# Patient Record
Sex: Female | Born: 1980 | Race: Black or African American | Hispanic: No | Marital: Single | Smoking: Current every day smoker
Health system: Southern US, Community
[De-identification: ages and names within clinical notes are randomized; demographics above are authoritative.]

## PROBLEM LIST (undated history)

## (undated) DIAGNOSIS — I10 Essential (primary) hypertension: Secondary | ICD-10-CM

## (undated) HISTORY — PX: TONSILLECTOMY: SUR1361

## (undated) HISTORY — PX: OTHER SURGICAL HISTORY: SHX169

---

## 2001-09-07 ENCOUNTER — Inpatient Hospital Stay (HOSPITAL_COMMUNITY): Admission: AD | Admit: 2001-09-07 | Discharge: 2001-09-07 | Payer: Self-pay | Admitting: *Deleted

## 2001-10-02 ENCOUNTER — Emergency Department (HOSPITAL_COMMUNITY): Admission: EM | Admit: 2001-10-02 | Discharge: 2001-10-03 | Payer: Self-pay | Admitting: Emergency Medicine

## 2001-10-14 ENCOUNTER — Other Ambulatory Visit: Admission: RE | Admit: 2001-10-14 | Discharge: 2001-10-14 | Payer: Self-pay | Admitting: *Deleted

## 2002-05-31 ENCOUNTER — Emergency Department (HOSPITAL_COMMUNITY): Admission: EM | Admit: 2002-05-31 | Discharge: 2002-05-31 | Payer: Self-pay | Admitting: Emergency Medicine

## 2002-09-14 ENCOUNTER — Other Ambulatory Visit: Admission: RE | Admit: 2002-09-14 | Discharge: 2002-09-14 | Payer: Self-pay | Admitting: *Deleted

## 2003-03-24 ENCOUNTER — Inpatient Hospital Stay (HOSPITAL_COMMUNITY): Admission: AD | Admit: 2003-03-24 | Discharge: 2003-03-28 | Payer: Self-pay | Admitting: *Deleted

## 2003-05-04 ENCOUNTER — Other Ambulatory Visit: Admission: RE | Admit: 2003-05-04 | Discharge: 2003-05-04 | Payer: Self-pay | Admitting: *Deleted

## 2003-07-20 ENCOUNTER — Emergency Department (HOSPITAL_COMMUNITY): Admission: EM | Admit: 2003-07-20 | Discharge: 2003-07-20 | Payer: Self-pay | Admitting: Emergency Medicine

## 2003-08-03 ENCOUNTER — Inpatient Hospital Stay (HOSPITAL_COMMUNITY): Admission: AD | Admit: 2003-08-03 | Discharge: 2003-08-03 | Payer: Self-pay | Admitting: Obstetrics and Gynecology

## 2003-08-05 ENCOUNTER — Inpatient Hospital Stay (HOSPITAL_COMMUNITY): Admission: AD | Admit: 2003-08-05 | Discharge: 2003-08-05 | Payer: Self-pay | Admitting: *Deleted

## 2004-05-17 ENCOUNTER — Observation Stay (HOSPITAL_COMMUNITY): Admission: AD | Admit: 2004-05-17 | Discharge: 2004-05-17 | Payer: Self-pay | Admitting: Obstetrics and Gynecology

## 2004-05-22 ENCOUNTER — Other Ambulatory Visit: Admission: RE | Admit: 2004-05-22 | Discharge: 2004-05-22 | Payer: Self-pay | Admitting: Obstetrics and Gynecology

## 2004-10-17 ENCOUNTER — Inpatient Hospital Stay (HOSPITAL_COMMUNITY): Admission: AD | Admit: 2004-10-17 | Discharge: 2004-10-17 | Payer: Self-pay | Admitting: Obstetrics and Gynecology

## 2004-11-23 ENCOUNTER — Inpatient Hospital Stay (HOSPITAL_COMMUNITY): Admission: RE | Admit: 2004-11-23 | Discharge: 2004-11-26 | Payer: Self-pay | Admitting: Obstetrics and Gynecology

## 2005-01-03 ENCOUNTER — Other Ambulatory Visit: Admission: RE | Admit: 2005-01-03 | Discharge: 2005-01-03 | Payer: Self-pay | Admitting: Obstetrics and Gynecology

## 2005-07-10 ENCOUNTER — Inpatient Hospital Stay (HOSPITAL_COMMUNITY): Admission: AD | Admit: 2005-07-10 | Discharge: 2005-07-10 | Payer: Self-pay | Admitting: Obstetrics & Gynecology

## 2007-10-03 ENCOUNTER — Inpatient Hospital Stay (HOSPITAL_COMMUNITY): Admission: AD | Admit: 2007-10-03 | Discharge: 2007-10-03 | Payer: Self-pay | Admitting: Obstetrics and Gynecology

## 2007-11-09 ENCOUNTER — Inpatient Hospital Stay (HOSPITAL_COMMUNITY): Admission: AD | Admit: 2007-11-09 | Discharge: 2007-11-09 | Payer: Self-pay | Admitting: Obstetrics and Gynecology

## 2008-02-20 ENCOUNTER — Emergency Department (HOSPITAL_COMMUNITY): Admission: EM | Admit: 2008-02-20 | Discharge: 2008-02-20 | Payer: Self-pay | Admitting: Emergency Medicine

## 2008-05-17 ENCOUNTER — Inpatient Hospital Stay (HOSPITAL_COMMUNITY): Admission: RE | Admit: 2008-05-17 | Discharge: 2008-05-20 | Payer: Self-pay | Admitting: Obstetrics and Gynecology

## 2008-05-17 ENCOUNTER — Encounter (INDEPENDENT_AMBULATORY_CARE_PROVIDER_SITE_OTHER): Payer: Self-pay | Admitting: Obstetrics and Gynecology

## 2010-04-17 LAB — CBC
HCT: 31.8 % — ABNORMAL LOW (ref 36.0–46.0)
HCT: 36.4 % (ref 36.0–46.0)
Hemoglobin: 10.7 g/dL — ABNORMAL LOW (ref 12.0–15.0)
Hemoglobin: 12.3 g/dL (ref 12.0–15.0)
MCHC: 33.5 g/dL (ref 30.0–36.0)
MCV: 89.7 fL (ref 78.0–100.0)
RBC: 4.11 MIL/uL (ref 3.87–5.11)
RDW: 14.8 % (ref 11.5–15.5)
RDW: 14.8 % (ref 11.5–15.5)
WBC: 10.3 10*3/uL (ref 4.0–10.5)

## 2010-05-22 NOTE — Op Note (Signed)
NAME:  Dorothy Jimenez, Dorothy Jimenez               ACCOUNT NO.:  [id number]   MEDICAL RECORD NO.:  [id number]          PATIENT TYPE:  INP   LOCATION:  9143                          FACILITY:  [facility]   PHYSICIAN:  Michelle L. Grewal, M.D.DATE OF BIRTH:  May 23, 1980   DATE OF PROCEDURE:  05/17/2008  DATE OF DISCHARGE:                               OPERATIVE REPORT   PREOPERATIVE DIAGNOSES:  1. Intrauterine pregnancy at 39+ weeks.  2. Previous cesarean section x2.  3. Desires a tubal sterilization.   POSTOPERATIVE DIAGNOSES:  1. Intrauterine pregnancy at 39+ weeks.  2. Previous cesarean section x2.  3. Desires a tubal sterilization.   PROCEDURE:  Repeat low transverse cesarean section and bilateral tubal  ligation modified Pomeroy method.   SURGEON:  Michelle L. Vincente Poli, MD.   ANESTHESIA:  Spinal.   FINDINGS:  Female infant in cephalic presentation.  Apgars of 9 at 1  minute and 9 at 5 minutes.   SPECIMENS:  This fallopian tube segments sent to Pathology.   ESTIMATED BLOOD LOSS:  Less than 500.   COMPLICATIONS:  None.   DESCRIPTION OF PROCEDURE:  The patient is taken to the operating room.  Her spinal was placed and found to be adequate.  She was prepped and  draped in usual sterile fashion.  A Foley catheter was inserted and  draining clear urine.  A low transverse incision was made and carried  down to the fascia.  Fascia scored in the midline and extended  laterally.  The rectus muscles were separated in midline.  The  peritoneum was entered bluntly.  The peritoneal incision was then  stretched.  The bladder blade was inserted.  The lower uterine segment  was identified.  The bladder flap was created sharply and then  digitally.  A low transverse incision was made in the uterus.  Uterus  was entered using a hemostat.  Amniotic fluid was clear.  She had  abundant amniotic fluid.  The baby was in cephalic presentation, was  delivered easily with one gentle pull of the vacuum.  The baby was  a  female infant at Apgars 9 at 1 minute and 9 at 5 minutes and was handed  to the awaiting neonatal team and taken to the newborn nursery.  The  cord was noted to be normal intact with a three-vessel cord.  The  placenta was manually removed, noted be normal and intact.  The  antibiotics and Pitocin had been given.  The uterus was exteriorized.  It was cleared of all clots and debris and was closed in 1 layer using 0  chromic in a running locked stitch, was hemostatic.  At this point we  then performed a modified Pomeroy bilateral tubal ligation by  identifying each tubal segment, grasping each midportion with a Babcock  clamp, tied it off with a plain gut suture x2 and excising the tied off  knuckle with Metzenbaum scissors.  I then cauterized the ends.  Each  tubal segment was sent to Pathology.  The uterus was returned to the  abdomen.  Irrigation was performed.  Hemostasis was excellent.  The  peritoneum and rectus muscles  were reapproximated using 0 Vicryl.  The fascia was closed using 0  Vicryl running stitch and after irrigation of subcutaneous layer, noting  hemostasis.  The skin was closed with staples.  All sponge, lap and  instrument counts were correct x2.  The patient went to recovery room in  stable condition.      Michelle L. Vincente Poli, M.D.  Electronically Signed     MLG/MEDQ  D:  05/17/2008  T:  05/18/2008  Job:  [id number]

## 2010-05-22 NOTE — Discharge Summary (Signed)
NAMESHAROLYN, Dorothy Jimenez               ACCOUNT NO.:  [id number]   MEDICAL RECORD NO.:  [id number]          PATIENT TYPE:  INP   LOCATION:  9143                          [facility]  [facility]   PHYSICIAN:  Zelphia Cairo, MD    DATE OF BIRTH:  06/12/1980   DATE OF ADMISSION:  05/17/2008  DATE OF DISCHARGE:  05/20/2008                               DISCHARGE SUMMARY   ADMITTING DIAGNOSES:  1. Intrauterine pregnancy at 62 weeks' estimated gestational age.  2. Previous cesarean section.  3. Desires repeat.  4. Multiparity, desires permanent sterilization.   DISCHARGE DIAGNOSES:  1. Status post low transverse cesarean section.  2. Viable female infant.  3. Permanent sterilization.   PROCEDURE:  1. Repeat low transverse cesarean section.  2. Bilateral tubal ligation.   REASON FOR ADMISSION:  Please see written H and P.   HOSPITAL COURSE:  The patient is a 28-year African American gravida 3,  para 2, that was admitted to [facility] at 67 weeks'  estimated gestational age for scheduled cesarean section.  Due to  multiparity, the patient also requested permanent sterilization.  On the  morning of admission, the patient was taken to the operating room where  spinal anesthesia was administered without difficulty.  A low transverse  incision was made with delivery of a viable female infant with Apgars of  9 at 1-minute and 9 at 5 minutes.  Bilateral tubal ligation was  performed without difficulty.  The patient tolerated the procedure well  and taken to the recovery room in stable condition.  On postoperative  day #1, the patient was without complaints.  Vital signs were stable.  She was afebrile.  Fundus was firm and nontender.  Abdominal dressing  noted to have a small amount of drainage noted on the left margin of the  bandage.  Foley had been discontinued and she was voiding well.  Laboratory findings showed hemoglobin of 10.7, platelet count 135,000,  WBC count  15.9.  On  postoperative day #2, the patient had not been able  to pass any flatus.  She had been given a suppository and she had small  amount of results.  Vital signs were stable.  She was afebrile.  Abdomen  was noted to be slightly distended.  Bowel sounds noted to be somewhat  decreased.  Fundus was firm and nontender.  Incision was noted to have a  small amount of moisture underneath the pannus.  No erythema or  ecchymosis noted.  She is ambulating well.  On postoperative day #3, the  patient was without complaint.  She had had a BM at the time of  rounding.  Vital signs were stable.  She was afebrile.  Fundus firm and  nontender.  Incision was clean, dry, and intact.  Staples were left  intact.  Discharge instructions were reviewed and the patient was later  discharged home.   CONDITION ON DISCHARGE:  Stable.   DIET:  Regular as tolerated.   ACTIVITY:  No heavy lifting, no driving x2 weeks, no vaginal entry.   FOLLOWUP:  The patient is to follow up  in the office in 4-5 days for  staple removal.  She is to call for temperature greater than 100  degrees, persistent nausea, vomiting, heavy vaginal bleeding and/or  redness or drainage from incisional site.   DISCHARGE MEDICATIONS:  1. Percocet 5/325 mg, #40, one to two every 4 hours as needed.  2. Motrin 600 mg every 6 hours.  3. Prenatal vitamins 1 p.o. daily.  4. Colace 1 p.o. daily p.r.n.      Julio Sicks, N.P.      Zelphia Cairo, MD  Electronically Signed    CC/MEDQ  D:  05/20/2008  T:  05/20/2008  Job:  [id number]

## 2010-05-25 NOTE — Discharge Summary (Signed)
NAME:  Dorothy Jimenez, Dorothy Jimenez                         ACCOUNT NO.:  [id number]   MEDICAL RECORD NO.:  [id number]                   PATIENT TYPE:  INP   LOCATION:  9137                                 [facility]   PHYSICIAN:  Juluis Mire, M.D.                DATE OF BIRTH:  1980/10/10   DATE OF ADMISSION:  03/24/2003  DATE OF DISCHARGE:  03/28/2003                                 DISCHARGE SUMMARY   ADMITTING DIAGNOSES:  1. Intrauterine pregnancy at 64 and five-sevenths weeks.  2. Induction of labor.   DISCHARGE DIAGNOSES:  1. Status post low transverse cesarean section secondary to failure to     progress.  2. Viable female infant.   PROCEDURE:  Primary low transverse cesarean section.   REASON FOR ADMISSION:  Please see written H&P.   HOSPITAL COURSE:  The patient was a 30 year old primigravida that was  admitted to [facility] at 91 and two-sevenths weeks for an  induction of labor.  There had been some concern for relative cephalopelvic  disproportion with the patient's height being only 4 feet 8 inches.  Estimated fetal weight was estimated at 7 pounds.  On the morning of  admission fetal heart tones were reassuring.  Cervix was dilated to 1-2 cm,  50% effaced, vertex presentation.  Artificial rupture of membranes was  performed without difficulty revealing clear fluid.  Later that evening  intrauterine pressure catheter was placed for observation of labor pattern.  Cervix was now dilated to 7 cm, completely effaced, with vertex at a 0  station.  Fetal heart tones continued to be reassuring.  The patient  strongly wanted to continue effort for normal spontaneous vaginal delivery  and the patient was allowed to continue to labor and that fetal heart tones  continued to be reassuring.  After several more hours of labor cervix was  now 9 cm.  Decision was made to proceed with a low transverse cesarean  section due to cephalopelvic disproportion.  The patient was  then  transferred to the operating room where epidural was dosed to an adequate  surgical level.  A low transverse incision was made with the delivery of a  viable female infant weighing 7 pounds 0 ounces with Apgars of 9 at one minute  and 9 at five minutes.  The patient tolerated the procedure well and was  taken to the recovery room in stable condition.  On postoperative day #1  vital signs were stable; the patient was afebrile.  Abdomen was soft with  good return of bowel function.  Fundus was firm and nontender.  Abdominal  dressing was noted to have a small amount of drainage noted on bandage.  On  postoperative day #2 vital signs were stable; the patient was afebrile.  Abdomen was soft, fundus was firm and nontender.  Abdominal dressing had  been removed revealing an incision that was clean, dry, and  intact.  Labs  revealed hemoglobin of 10.2; platelet count of 130,000; wbc count of 15.3.  On postoperative day #3 vital signs were stable; she was afebrile.  Fundus  was firm and nontender.  Incision was clean, dry, and intact.  She was  ambulating well.  On postoperative day #4 vital signs were stable; the  patient remained afebrile.  Abdomen was soft, fundus was firm and nontender.  Incision was clean, dry, and intact.  Staples were removed and the patient  was discharged home.   CONDITION ON DISCHARGE:  Good.   DIET:  Regular as tolerated..   ACTIVITY:  No heavy lifting, no driving x2 weeks, no vaginal entry.   FOLLOW-UP:  The patient is to follow up in the office in 1-2 weeks for an  incision check.  She is to call for temperature greater than 100 degrees,  persistent nausea and vomiting, heavy vaginal bleeding, and/or redness or  drainage from the incisional site.   DISCHARGE MEDICATIONS:  1. Tylox  #30 one p.o. q.4-6h. p.r.n.  2. Motrin 600 mg q.6h.  3. Prenatal vitamins one p.o. daily.  4. Colace one p.o. daily p.r.n.     Julio Sicks, N.P.                        Juluis Mire, M.D.    CC/MEDQ  D:  04/18/2003  T:  04/18/2003  Job:  [id number]

## 2010-05-25 NOTE — Op Note (Signed)
NAMESUMIYE, HIRTH               ACCOUNT NO.:  [id number]   MEDICAL RECORD NO.:  [id number]          PATIENT TYPE:  INP   LOCATION:  9133                          [facility]   PHYSICIAN:  Michelle L. Grewal, M.D.DATE OF BIRTH:  1980-01-31   DATE OF PROCEDURE:  11/23/2004  DATE OF DISCHARGE:                                 OPERATIVE REPORT   PREOPERATIVE DIAGNOSIS:  Intrauterine pregnancy at term, previous C-section.   POSTOPERATIVE DIAGNOSIS:  Intrauterine pregnancy at term, previous C-  section.   PROCEDURE:  Repeat low transverse cesarean section.   SURGEON:  Michelle L. Vincente Poli, M.D.   ANESTHESIA:  Spinal.   SPECIMEN:  Female infant Apgars 7 at one minute and 9 at five minutes with  tight nuchal cord x1.   ESTIMATED BLOOD LOSS:  500 mL.   PROCEDURE:  Patient taken to the operating room. She was given her spinal  without incident. Prepped and draped in usual sterile fashion. Foley  catheter was inserted. A low transverse incision was made, carried down to  the fascia, fascia scored in midline and extended laterally. The rectus  muscles were separated in midline and peritoneum was entered bluntly.  The  peritoneum incision was stretched. The bladder blade was inserted, the lower  uterine segment identified, and the bladder flap created sharply. The low  transverse incision was made in the uterus and the uterus was entered using  hemostat. The baby was in cephalic presentation and was delivered easily  with two pulls of the vacuum extractor and no pop-offs. The baby was a  female infant Apgars 7 at one minute and 9 at five minutes and tight nuchal  cord x2. The baby was handed to the waiting pediatrician. Cord blood was  obtained. The placenta was manually removed and noted to be intact and  normal with three-vessel cord. The uterus was then cleared of all clots and  debris. The uterine incision was closed using 0 chromic in continuous  running locked stitch. It was  hemostatic. Irrigation was performed.  Hemostasis was again noted. The peritoneum was closed using 0 Vicryl in  continuous running stitch.  The rectus muscles were reapproximated using the  same 0 Vicryl. The fascia was closed using 0 Vicryl in continuous running  stitch starting at each corner and meeting in the midline.  After irrigation  of subcutaneous layers, skin was closed with staples. All sponge, lap and  instrument counts were correct x2. The patient went to recovery room in  stable condition.      Michelle L. Vincente Poli, M.D.  Electronically Signed     MLG/MEDQ  D:  11/23/2004  T:  11/23/2004  Job:  [id number]

## 2010-05-25 NOTE — Discharge Summary (Signed)
Dorothy Jimenez, Dorothy Jimenez               ACCOUNT NO.:  [id number]   MEDICAL RECORD NO.:  [id number]          PATIENT TYPE:  INP   LOCATION:  9133                          [facility]ACILITY[facility]  [facility]   PHYSICIAN:  Guy Sandifer. Henderson Cloud, M.D. DATE OF BIRTH:  Dec 14, 1980   DATE OF ADMISSION:  11/23/2004  DATE OF DISCHARGE:  11/26/2004                                 DISCHARGE SUMMARY   ADMITTING DIAGNOSES:  1.  Intrauterine pregnancy at term.  2.  Previous cesarean section, desires repeat   DISCHARGE DIAGNOSES:  1.  Status post low transverse cesarean section.  2.  Viable female infant.   PROCEDURE:  Repeat low transverse cesarean section.   REASON FOR ADMISSION:  Please see written H&P   HOSPITAL COURSE:  The patient was a 30 year old married female, gravida 2,  para 1 that was admitted to [facility] for a scheduled  cesarean section.  The patient had had a previous cesarean delivery and  desired repeat.  On the morning of admission, the patient was taken to the  operating room, where spinal anesthesia was administered without difficulty.  A low transverse incision was made with delivery of a viable female infant  weighing 6 pounds 14 ounces with Apgar's 7 at 1 minute and 9 at 5 minutes.  The patient tolerated procedure well and was taken to the recovery room in  stable condition.  On postoperative day #1, vital signs were stable.  She was afebrile.  Laboratory findings revealed hemoglobin of 10.7.  On postoperative day #2, the patient did complain of some abdominal  distension.  Vital signs were otherwise stable.  She was afebrile.  Abdomen  soft with positive bowel sounds.  Abdominal dressing had been removed  revealing an incision that was clean, dry, and intact.  Fundus was firm and  nontender.  The patient was ambulating well, tolerating a regular diet  without complaints of nausea and vomiting.  On postoperative day #3, the patient was without complaint.  Vital signs  remained  stable.  She was afebrile.  Abdomen soft.  Fundus firm and  nontender.  Incision was clean, dry, and intact.  Staples removed and the  patient was discharged home.   CONDITION ON DISCHARGE:  Good.   DIET:  Regular as tolerated.   ACTIVITY:  No heavy lifting, no driving x2 weeks, no vaginal entry.   FOLLOW UP:  Patient follow up in the office in 1-2 weeks for incision check.   She is to call for temperature greater than 100 degrees, persistent nausea  vomiting, heavy vaginal bleeding, and/or redness or drainage from the  incisional site.   DISCHARGE MEDICATIONS:  1.  Percocet 5/325, #30, one p.o. every four to six hours p.r.n.  2.  Motrin 600 mg every six hours p.r.n.  3.  Colace one p.o. daily p.r.n.      Dorothy Jimenez, N.P.      Guy Sandifer. Henderson Cloud, M.D.  Electronically Signed    CC/MEDQ  D:  12/21/2004  T:  12/22/2004  Job:  [id number]

## 2010-05-25 NOTE — Op Note (Signed)
NAME:  Dorothy Jimenez, Dorothy Jimenez                         ACCOUNT NO.:  [id number]   MEDICAL RECORD NO.:  [id number]                   PATIENT TYPE:  INP   LOCATION:  9137                                 [facility]ACILITY[facility]   PHYSICIAN:  Tracie Harrier, M.D.              DATE OF BIRTH:  07-12-1980   DATE OF PROCEDURE:  03/25/2003  DATE OF DISCHARGE:                                 OPERATIVE REPORT   PREOPERATIVE DIAGNOSES:  1. Intrauterine pregnancy at term.  2. Cephalopelvic disproportion.   POSTOPERATIVE DIAGNOSES:  1. Intrauterine pregnancy at term.  2. Cephalopelvic disproportion.   PROCEDURE:  Primary low transverse cesarean section.   SURGEON:  Tracie Harrier, M.D.   ANESTHESIA:  Epidural.   ESTIMATED BLOOD LOSS:  800 mL.   COMPLICATIONS:  None.   FINDINGS:  At 75 through a low transverse uterine incision, a viable female  infant was delivered from the vertex presentation.  A good deal of caput was  noted.  The baby was a female weighing 7 pounds.  Apgars were 9 and 9.   The pelvis was visualized at time of surgery and noted to be normal.   PROCEDURE:  The patient was taken to the operating room, where an adequate  level of epidural anesthetic was administered.  The patient was placed in  the operating table in the left lateral tilt position.  A Foley catheter had  previously been inserted.  The abdomen was then entered through a  Pfannenstiel incision and carried down sharply in the usual fashion.  The  peritoneum was atraumatically entered.  The vesicouterine peritoneum  overlying the lower uterine segment was incised and a bladder flap was  bluntly and sharply created over the lower uterine segment.  A bladder blade  was then placed behind the bladder to ensure its protection during the  procedure.  The uterus was then entered through a low transverse incision  and carried down sharply in the usual fashion.  The peritoneum was  atraumatically entered.  The uterus was then  entered through a low  transverse incision and carried out laterally using the operator's fingers.  The membranes were entered with clear fluid noted.  The vertex was elevated  into the incision and delivered promptly and easily at 0233.  The oropharynx  and nasopharynx were thoroughly bulb-suctioned and the cord doubly clamped  and cut.  The baby handed promptly to the pediatricians.  As mentioned, the  baby was a female weighing 7 pounds.  Apgars were 9 and 9.  The baby did well.   The placenta was then manually extracted intact with three-vessel cord  without difficulty.  The interior of the uterus was wiped clean thoroughly  with a wet sponge.  The uterine incision was then closed in a two-layer  fashion.  The first layer a running interlocking suture of #1 Vicryl.  A  second imbricating suture was placed across  the primary suture line with a  running suture of #1 Vicryl as well.  Good hemostasis was noted.  The pelvis  was visualized and noted to be normal.  Next, the rectus muscle and anterior  peritoneum was closed with a running suture of #1 Vicryl.  The subfascial  layers were noted to be hemostatic.  The fascia was then closed with a  running suture of 0 PDS.  The subcutaneous tissue was irrigated and made  hemostatic using the Bovie cautery.  The skin reapproximated with staples  and a sterile dressing applied.   Final sponge, needle, and instrument count was correct x3.  There were no  perioperative complications.  The surgery went well and the baby did well.                                               Tracie Harrier, M.D.    REG/MEDQ  D:  03/25/2003  T:  03/26/2003  Job:  [id number]

## 2010-10-09 LAB — URINALYSIS, ROUTINE W REFLEX MICROSCOPIC
Bilirubin Urine: NEGATIVE
Hgb urine dipstick: NEGATIVE
Ketones, ur: NEGATIVE
Protein, ur: NEGATIVE
Urobilinogen, UA: 0.2

## 2010-10-09 LAB — DIFFERENTIAL
Basophils Absolute: 0
Lymphs Abs: 1.1
Monocytes Absolute: 0.6
Neutro Abs: 13.3 — ABNORMAL HIGH

## 2010-10-09 LAB — COMPREHENSIVE METABOLIC PANEL
ALT: 25
AST: 21
CO2: 24
Calcium: 9.1
GFR calc non Af Amer: >60
Total Protein: 6

## 2010-10-09 LAB — CBC
HCT: 35.7 — ABNORMAL LOW
MCHC: 31.7
MCV: 79.1
Platelets: 220

## 2012-07-15 ENCOUNTER — Other Ambulatory Visit: Payer: Self-pay | Admitting: Obstetrics and Gynecology

## 2015-11-18 ENCOUNTER — Encounter (HOSPITAL_COMMUNITY): Payer: Self-pay | Admitting: Emergency Medicine

## 2015-11-18 ENCOUNTER — Emergency Department (HOSPITAL_COMMUNITY)
Admission: EM | Admit: 2015-11-18 | Discharge: 2015-11-18 | Disposition: A | Discharge status: Home / Self Care | Payer: BLUE CROSS/BLUE SHIELD | Attending: Emergency Medicine | Admitting: Emergency Medicine

## 2015-11-18 ENCOUNTER — Emergency Department (HOSPITAL_COMMUNITY): Payer: BLUE CROSS/BLUE SHIELD

## 2015-11-18 DIAGNOSIS — F17200 Nicotine dependence, unspecified, uncomplicated: Secondary | ICD-10-CM | POA: Insufficient documentation

## 2015-11-18 DIAGNOSIS — R0789 Other chest pain: Secondary | ICD-10-CM | POA: Diagnosis not present

## 2015-11-18 DIAGNOSIS — D509 Iron deficiency anemia, unspecified: Secondary | ICD-10-CM | POA: Diagnosis not present

## 2015-11-18 DIAGNOSIS — R071 Chest pain on breathing: Secondary | ICD-10-CM | POA: Diagnosis present

## 2015-11-18 LAB — BASIC METABOLIC PANEL
Anion gap: 9 (ref 5–15)
BUN: 8 mg/dL (ref 6–20)
CALCIUM: 9 mg/dL (ref 8.9–10.3)
CO2: 22 mmol/L (ref 22–32)
CREATININE: 0.62 mg/dL (ref 0.44–1.00)
Chloride: 106 mmol/L (ref 101–111)
Glucose, Bld: 76 mg/dL (ref 65–99)
Potassium: 3.5 mmol/L (ref 3.5–5.1)
SODIUM: 137 mmol/L (ref 135–145)

## 2015-11-18 LAB — CBC
HCT: 28.2 % — ABNORMAL LOW (ref 36.0–46.0)
Hemoglobin: 8 g/dL — ABNORMAL LOW (ref 12.0–15.0)
MCH: 18.8 pg — ABNORMAL LOW (ref 26.0–34.0)
MCHC: 28.4 g/dL — ABNORMAL LOW (ref 30.0–36.0)
MCV: 66.4 fL — AB (ref 78.0–100.0)
PLATELETS: 179 10*3/uL (ref 150–400)
RBC: 4.25 MIL/uL (ref 3.87–5.11)
RDW: 18.8 % — AB (ref 11.5–15.5)
WBC: 10.9 10*3/uL — AB (ref 4.0–10.5)

## 2015-11-18 LAB — I-STAT TROPONIN, ED: TROPONIN I, POC: 0 ng/mL (ref 0.00–0.08)

## 2015-11-18 NOTE — ED Triage Notes (Signed)
Per pt, states chest tightness since Tuesday-on and off, not constant-sometimes SOB-no cardiac history

## 2015-11-18 NOTE — ED Notes (Signed)
Bed: WA01 Expected date:  Expected time:  Means of arrival:  Comments: Hold for triage 1

## 2015-11-18 NOTE — Discharge Instructions (Signed)
Try zantac 150mg twice a day.  ° ° °

## 2015-11-18 NOTE — ED Provider Notes (Signed)
[facility]-EMERGENCY DEPT Provider Note   CSN: [id number] Arrival date & time: 11/18/15  1447     History   Chief Complaint Chief Complaint  Patient presents with  . Chest Pain    HPI Bland SpanFelicia R Dobratz is a 35 y.o. female.  35 yo F with a chief complaint of chest pain. This been going on for the past 3 or 4 days. Worse with movement palpation. Has some difficulty with deep breathing. Denies history of PE or DVT. Denies hemoptysis denies oral contraceptive use denies shortness breath. Does feel like she has difficulty taking a deep breath due to the pain. Denies injury. Denies history of MI in the family. She is a smoker.   The history is provided by the patient.  Chest Pain   This is a new problem. The current episode started more than 2 days ago. The problem occurs constantly. The problem has not changed since onset.The pain is associated with movement, raising an arm and breathing. The pain is present in the substernal region. The pain is at a severity of 6/10. The pain is moderate. The quality of the pain is described as brief and sharp. The pain does not radiate. Duration of episode(s) is 2 days. Associated symptoms include abdominal pain. Pertinent negatives include no dizziness, no fever, no headaches, no nausea, no palpitations, no shortness of breath and no vomiting. She has tried nothing for the symptoms. The treatment provided no relief.    History reviewed. No pertinent past medical history.  There are no active problems to display for this patient.   History reviewed. No pertinent surgical history.  OB History    No data available       Home Medications    Prior to Admission medications   Not on File    Family History No family history on file.  Social History Social History  Substance Use Topics  . Smoking status: Current Some Day Smoker  . Smokeless tobacco: Not on file  . Alcohol use Yes     Allergies   Patient has no allergy information on  record.   Review of Systems Review of Systems  Constitutional: Negative for chills and fever.  HENT: Negative for congestion and rhinorrhea.   Eyes: Negative for redness and visual disturbance.  Respiratory: Negative for shortness of breath and wheezing.   Cardiovascular: Positive for chest pain. Negative for palpitations.  Gastrointestinal: Positive for abdominal pain. Negative for nausea and vomiting.  Genitourinary: Negative for dysuria and urgency.  Musculoskeletal: Negative for arthralgias and myalgias.  Skin: Negative for pallor and wound.  Neurological: Negative for dizziness and headaches.     Physical Exam Updated Vital Signs BP 107/72   Pulse 67   Temp 98.7 F (37.1 C) (Oral)   Resp 16   LMP 10/22/2015   SpO2 100%   Physical Exam  Constitutional: She is oriented to person, place, and time. She appears well-developed and well-nourished. No distress.  HENT:  Head: Normocephalic and atraumatic.  Eyes: EOM are normal. Pupils are equal, round, and reactive to light.  Neck: Normal range of motion. Neck supple.  Cardiovascular: Normal rate and regular rhythm.  Exam reveals no gallop and no friction rub.   No murmur heard. Pulmonary/Chest: Effort normal. She has no wheezes. She has no rales. She exhibits tenderness (tender palpation about the anterior chest which reproduces her tenderness.).  Abdominal: Soft. She exhibits no distension. There is tenderness (mild worsening epigastric tenderness).  Musculoskeletal: She exhibits no edema or tenderness.  Neurological: She is alert and oriented to person, place, and time.  Skin: Skin is warm and dry. She is not diaphoretic.  Psychiatric: She has a normal mood and affect. Her behavior is normal.  Nursing note and vitals reviewed.    ED Treatments / Results  Labs (all labs ordered are listed, but only abnormal results are displayed) Labs Reviewed  CBC - Abnormal; Notable for the following:       Result Value   WBC 10.9  (*)    Hemoglobin 8.0 (*)    HCT 28.2 (*)    MCV 66.4 (*)    MCH 18.8 (*)    MCHC 28.4 (*)    RDW 18.8 (*)    All other components within normal limits  BASIC METABOLIC PANEL  I-STAT TROPOININ, ED    EKG  EKG Interpretation  Date/Time:  Saturday November 18 2015 14:58:50 EST Ventricular Rate:  77 PR Interval:    QRS Duration: 81 QT Interval:  358 QTC Calculation: 406 R Axis:   55 Text Interpretation:  Sinus rhythm No significant change since last tracing Confirmed by Lilana Blasko MD, DANIEL [phone]) on 11/18/2015 4:14:13 PM       Radiology Dg Chest 2 View  Result Date: 11/18/2015 CLINICAL DATA:  35 year old female complaining of chest pain. EXAM: CHEST  2 VIEW COMPARISON:  None FINDINGS: The heart size and mediastinal contours are within normal limits. Both lungs are clear. The visualized skeletal structures are unremarkable. IMPRESSION: No active cardiopulmonary disease. Electronically Signed   By: Signa Kell M.D.   On: 11/18/2015 16:05    Procedures Procedures (including critical care time)  Medications Ordered in ED Medications - No data to display   Initial Impression / Assessment and Plan / ED Course  I have reviewed the triage vital signs and the nursing notes.  Pertinent labs & imaging results that were available during my care of the patient were reviewed by me and considered in my medical decision making (see chart for details).  Clinical Course     35 yo F With a chief complaint chest pain. This is reproducible on exam. I suspect this is musculoskeletal. We'll have her do a trial of NSAIDs and Tylenol. ECP follow-up. Patient was also found to have worsening microcytic anemia. I discussed this result with patient she felt like it was about her baseline. She is having trouble tolerating iron tablets.  Discussed smoking cessation with patient and was they were offerred resources to help stop.  Total time was 5 min CPT code 60454.   4:45 PM:  I have discussed  the diagnosis/risks/treatment options with the patient and believe the pt to be eligible for discharge home to follow-up with PCP. We also discussed returning to the ED immediately if new or worsening sx occur. We discussed the sx which are most concerning (e.g., sudden worsening pain, fever, inability to tolerate by mouth) that necessitate immediate return. Medications administered to the patient during their visit and any new prescriptions provided to the patient are listed below.  Medications given during this visit Medications - No data to display   The patient appears reasonably screen and/or stabilized for discharge and I doubt any other medical condition or other [facility] requiring further screening, evaluation, or treatment in the ED at this time prior to discharge.    Final Clinical Impressions(s) / ED Diagnoses   Final diagnoses:  Atypical chest pain  Microcytic anemia    New Prescriptions There are no discharge medications for this  patient.    Melene Planan Taylorann Tkach, DO 11/18/15 [id number]

## 2017-05-07 ENCOUNTER — Other Ambulatory Visit: Payer: Self-pay | Admitting: Family Medicine

## 2017-05-07 DIAGNOSIS — G4489 Other headache syndrome: Secondary | ICD-10-CM

## 2017-05-17 ENCOUNTER — Ambulatory Visit
Admission: RE | Admit: 2017-05-17 | Discharge: 2017-05-17 | Disposition: A | Discharge status: Home / Self Care | Payer: Managed Care, Other (non HMO) | Source: Ambulatory Visit | Attending: Family Medicine | Admitting: Family Medicine

## 2017-05-17 DIAGNOSIS — G4489 Other headache syndrome: Secondary | ICD-10-CM

## 2018-02-14 ENCOUNTER — Emergency Department (HOSPITAL_COMMUNITY)
Admission: EM | Admit: 2018-02-14 | Discharge: 2018-02-14 | Disposition: A | Discharge status: Home / Self Care | Payer: Managed Care, Other (non HMO) | Attending: Emergency Medicine | Admitting: Emergency Medicine

## 2018-02-14 ENCOUNTER — Encounter (HOSPITAL_COMMUNITY): Payer: Self-pay

## 2018-02-14 DIAGNOSIS — F17200 Nicotine dependence, unspecified, uncomplicated: Secondary | ICD-10-CM | POA: Insufficient documentation

## 2018-02-14 DIAGNOSIS — M940 Chondrocostal junction syndrome [Tietze]: Secondary | ICD-10-CM | POA: Diagnosis not present

## 2018-02-14 DIAGNOSIS — R079 Chest pain, unspecified: Secondary | ICD-10-CM | POA: Diagnosis present

## 2018-02-14 MED ORDER — ORPHENADRINE CITRATE ER 100 MG PO TB12: 100.0000 mg | ORAL_TABLET | Freq: Two times a day (BID) | ORAL | 0 refills | Status: AC

## 2018-02-14 MED ORDER — IBUPROFEN 800 MG PO TABS: 800.0000 mg | ORAL_TABLET | Freq: Three times a day (TID) | ORAL | 0 refills | Status: AC

## 2018-02-14 MED ORDER — KETOROLAC TROMETHAMINE 60 MG/2ML IM SOLN
60.0000 mg | Freq: Once | INTRAMUSCULAR | Status: AC
  Administered 2018-02-14: 60 mg via INTRAMUSCULAR
  Filled 2018-02-14: qty 2

## 2018-02-14 NOTE — Discharge Instructions (Addendum)
1.  Follow-up with your family doctor in 3 to 5 days.  Take ibuprofen 800 mg 3 times a day with food on your stomach for the next 2 days, then start decreasing your dose.  Take omeprazole daily for the next 2 weeks.

## 2018-02-14 NOTE — ED Triage Notes (Signed)
Patient presented to ed with c/o chest pain with movement and to the touch. Patient state started yesterday at work.

## 2018-02-14 NOTE — ED Provider Notes (Signed)
[facility]-EMERGENCY DEPT Provider Note   CSN: [id number] Arrival date & time: 02/14/18  [id number]     History   Chief Complaint No chief complaint on file.   HPI Bland SpanFelicia R Coalson is a 38 y.o. female.  HPI Patient reports she works at [employer].  She is started to get chest pain yesterday at work.  She noticed that particularly with movement of the right arm.  Lifting or moving the right arm started to cause a lot of pain right in the center of her chest by her sternum.  She reports by evening it had really gotten quite bad.  She did not have associated symptoms.  It was her mother's birthday so she went to dinner with family.  She reports she had no difficulty swallowing or breathing.  No pain with swallowing.  She had tried 200 mg of ibuprofen at about 530 and got minimal relief of symptoms.  She reports during the night she had trouble getting comfortable.  She could not really sleep because any position or movement of her chest caused sharp aching pain.  No associated cough or fever at this time.  Patient reports about a month ago she did have what seemed like a common cold.  That did resolve.  She reports she is off-and-on had a mild sore throat which she attributed to her kids being sick.  No fever that she is aware of no difficulty swallowing or breathing.  No abdominal pain no nausea no vomiting.  No lower extremity swelling or calf pain. History reviewed. No pertinent past medical history.  There are no active problems to display for this patient.   History reviewed. No pertinent surgical history.   OB History   No obstetric history on file.      Home Medications    Prior to Admission medications   Medication Sig Start Date End Date Taking? Authorizing Provider  Cholecalciferol (VITAMIN D3) 1.25 MG (50000 UT) CAPS Take 50,000 Units by mouth once a week. 03/17/17  Yes [provider]  ibuprofen (ADVIL,MOTRIN) 200 MG tablet Take 400 mg by mouth  every 6 (six) hours as needed for moderate pain.   Yes [provider]  ibuprofen (ADVIL,MOTRIN) 800 MG tablet Take 1 tablet (800 mg total) by mouth 3 (three) times daily. 02/14/18   Arby BarrettePfeiffer, Chandra Feger, MD  orphenadrine (NORFLEX) 100 MG tablet Take 1 tablet (100 mg total) by mouth 2 (two) times daily. 02/14/18   Arby BarrettePfeiffer, Mylin Hirano, MD    Family History No family history on file.  Social History Social History   Tobacco Use  . Smoking status: Current Some Day Smoker  Substance Use Topics  . Alcohol use: Yes  . Drug use: Not on file     Allergies   Doxycycline   Review of Systems Review of Systems 10 Systems reviewed and are negative for acute change except as noted in the HPI.  Physical Exam Updated Vital Signs BP (!) 145/98 (BP Location: Right Arm)   Pulse 73   Temp 98.5 F (36.9 C) (Oral)   Resp 16   SpO2 100%   Physical Exam Constitutional:      Appearance: Normal appearance.  HENT:     Head: Normocephalic and atraumatic.     Mouth/Throat:     Comments: Posterior airway widely patent.  No tonsillar erythema or exudate. Eyes:     Extraocular Movements: Extraocular movements intact.  Neck:     Musculoskeletal: Neck supple. No muscular tenderness.  Cardiovascular:     Rate and Rhythm: Normal rate and regular rhythm.     Pulses: Normal pulses.     Heart sounds: Normal heart sounds.  Pulmonary:     Effort: Pulmonary effort is normal.     Breath sounds: Normal breath sounds.     Comments: Right sternal costal margin exquisitely tender to palpation.  Reproduced by movement of the arms.  No erythema or swelling of soft tissues of the breasts or chest wall.  No rash. Chest:     Chest wall: Tenderness present.  Abdominal:     General: There is no distension.     Palpations: Abdomen is soft.     Tenderness: There is no abdominal tenderness. There is no guarding.     Comments: No epigastric pain to palpation.  No left or right upper quadrant pain to palpation.    Musculoskeletal: Normal range of motion.        General: No swelling or tenderness.     Right lower leg: No edema.     Left lower leg: No edema.  Lymphadenopathy:     Cervical: No cervical adenopathy.  Skin:    General: Skin is warm and dry.  Neurological:     General: No focal deficit present.     Mental Status: She is oriented to person, place, and time.     Coordination: Coordination normal.  Psychiatric:        Mood and Affect: Mood normal.      ED Treatments / Results  Labs (all labs ordered are listed, but only abnormal results are displayed) Labs Reviewed - No data to display  EKG None  Radiology No results found.  Procedures Procedures (including critical care time)  Medications Ordered in ED Medications  ketorolac (TORADOL) injection 60 mg (has no administration in time range)     Initial Impression / Assessment and Plan / ED Course  I have reviewed the triage vital signs and the nursing notes.  Pertinent labs & imaging results that were available during my care of the patient were reviewed by me and considered in my medical decision making (see chart for details).    Patient has 2 likely etiologies for her chest pain.  She has had what sounds consistent with viral upper respiratory illness without fever and mild associated symptoms.  Also, patient does hear dressing and is having frequent movements of the arms.  She is clinically well with normal vital signs.  Very low risk for emergent cardiac or pulmonary etiology.  Will treat with NSAID therapy.  Return precautions reviewed.  Final Clinical Impressions(s) / ED Diagnoses   Final diagnoses:  Costochondritis, acute    ED Discharge Orders         Ordered    ibuprofen (ADVIL,MOTRIN) 800 MG tablet  3 times daily     02/14/18 0919    orphenadrine (NORFLEX) 100 MG tablet  2 times daily     02/14/18 0919           Arby Barrette, MD 02/14/18 [phone]

## 2018-05-21 ENCOUNTER — Other Ambulatory Visit: Payer: Self-pay | Admitting: Family Medicine

## 2018-05-21 DIAGNOSIS — M79662 Pain in left lower leg: Secondary | ICD-10-CM

## 2018-05-26 ENCOUNTER — Ambulatory Visit
Admission: RE | Admit: 2018-05-26 | Discharge: 2018-05-26 | Disposition: A | Discharge status: Home / Self Care | Payer: Managed Care, Other (non HMO) | Source: Ambulatory Visit | Attending: Family Medicine | Admitting: Family Medicine

## 2018-05-26 DIAGNOSIS — M79662 Pain in left lower leg: Secondary | ICD-10-CM

## 2019-01-30 ENCOUNTER — Other Ambulatory Visit: Payer: Self-pay

## 2019-01-30 ENCOUNTER — Emergency Department (HOSPITAL_COMMUNITY)
Admission: EM | Admit: 2019-01-30 | Discharge: 2019-01-30 | Disposition: A | Discharge status: Home / Self Care | Payer: BC Managed Care – PPO | Attending: Emergency Medicine | Admitting: Emergency Medicine

## 2019-01-30 DIAGNOSIS — F17210 Nicotine dependence, cigarettes, uncomplicated: Secondary | ICD-10-CM | POA: Insufficient documentation

## 2019-01-30 DIAGNOSIS — M25521 Pain in right elbow: Secondary | ICD-10-CM | POA: Diagnosis present

## 2019-01-30 DIAGNOSIS — Y939 Activity, unspecified: Secondary | ICD-10-CM | POA: Insufficient documentation

## 2019-01-30 DIAGNOSIS — M7021 Olecranon bursitis, right elbow: Secondary | ICD-10-CM

## 2019-01-30 NOTE — Discharge Instructions (Signed)
You were evaluated in the Emergency Department and after careful evaluation, we did not find any emergent condition requiring admission or further testing in the hospital.  Your exam/testing today is overall reassuring.  Please take the indomethacin medication as directed.  Use the sling for comfort.  Please return to the Emergency Department if you experience any worsening of your condition.  We encourage you to follow up with a primary care provider.  Thank you for allowing [place] to be a part of your care.

## 2019-01-30 NOTE — ED Triage Notes (Signed)
Pt presents to the ED from home c/o of elbow pain and swelling x1 week. Pt was seen at [facility] earlier and was given a dx of gout but she is requesting a second opinion because she doesn't think it is gout. Pt works at [employer] and does do a lot of lifting but can't think of a specific injury that may have caused this.

## 2019-01-30 NOTE — ED Provider Notes (Signed)
[facility] Emergency Department Provider Note MRN:  [id number]  Arrival date & time: 01/30/19     Chief Complaint   Joint Swelling (elbow)   History of Present Illness   Dorothy Jimenez is a 39 y.o. year-old female with no pertinent past medical history presenting to the ED with chief complaint of elbow pain.  2 days of right elbow pain.  No trauma.  No fever.  Worse with motion.  Seems a bit swollen.  Pain is moderate to severe, trouble sleeping tonight.  Denies chest pain, no abdominal pain, no shortness of breath.  No history of gout.  Was diagnosed with gout 2 days ago but is here for a second opinion.  Review of Systems  A complete 10 system review of systems was obtained and all systems are negative except as noted in the HPI and PMH.   Patient's Health History   No past medical history on file.  No past surgical history on file.  No family history on file.  Social History   Socioeconomic History  . Marital status: Single    Spouse name: Not on file  . Number of children: Not on file  . Years of education: Not on file  . Highest education level: Not on file  Occupational History  . Not on file  Tobacco Use  . Smoking status: Current Some Day Smoker  Substance and Sexual Activity  . Alcohol use: Yes  . Drug use: Not on file  . Sexual activity: Not on file  Other Topics Concern  . Not on file  Social History Narrative  . Not on file   Social Determinants of Health   Financial Resource Strain:   . Difficulty of Paying Living Expenses: Not on file  Food Insecurity:   . Worried About Charity fundraiser in the Last Year: Not on file  . Ran Out of Food in the Last Year: Not on file  Transportation Needs:   . Lack of Transportation (Medical): Not on file  . Lack of Transportation (Non-Medical): Not on file  Physical Activity:   . Days of Exercise per Week: Not on file  . Minutes of Exercise per Session: Not on file  Stress:   . Feeling  of Stress : Not on file  Social Connections:   . Frequency of Communication with Friends and Family: Not on file  . Frequency of Social Gatherings with Friends and Family: Not on file  . Attends Religious Services: Not on file  . Active Member of Clubs or Organizations: Not on file  . Attends Archivist Meetings: Not on file  . Marital Status: Not on file  Intimate Partner Violence:   . Fear of Current or Ex-Partner: Not on file  . Emotionally Abused: Not on file  . Physically Abused: Not on file  . Sexually Abused: Not on file     Physical Exam   Vitals:   01/30/19 0258  BP: (!) 145/96  Pulse: 66  Resp: 18  Temp: 98.9 F (37.2 C)  SpO2: 96%    CONSTITUTIONAL: Well-appearing, NAD NEURO:  Alert and oriented x 3, no focal deficits EYES:  eyes equal and reactive ENT/NECK:  no LAD, no JVD CARDIO: Regular rate, well-perfused, normal S1 and S2 PULM:  CTAB no wheezing or rhonchi GI/GU:  normal bowel sounds, non-distended, non-tender MSK/SPINE: Mild edema noted to the right elbow, reduced range of motion due to pain SKIN:  no rash, atraumatic PSYCH:  Appropriate  speech and behavior  *Additional and/or pertinent findings included in MDM below  Diagnostic and Interventional Summary    EKG Interpretation  Date/Time:    Ventricular Rate:    PR Interval:    QRS Duration:   QT Interval:    QTC Calculation:   R Axis:     Text Interpretation:        Labs Reviewed - No data to display  No orders to display    Medications - No data to display   Procedures  /  Critical Care Ultrasound ED Soft Tissue  Date/Time: 01/30/2019 3:43 AM Performed by: Sabas Sous, MD Authorized by: Sabas Sous, MD   Procedure details:    Indications: limb pain     Indications comment:  Elbow pain   Transverse view:  Visualized   Longitudinal view:  Visualized   Images: archived   Location:    Side:  Right Comments:     Evaluation for signs of joint effusion.  No  evidence of joint effusion.  Evidence of edema to the olecranon bursa.    ED Course and Medical Decision Making  I have reviewed the triage vital signs, the nursing notes, and pertinent available records from the EMR.  Pertinent labs & imaging results that were available during my care of the patient were reviewed by me and considered in my medical decision making (see below for details).     Favoring olecranon bursitis, ultrasound performed as described above to evaluate for signs of effusion.  If there was an effusion, I would have performed arthrocentesis to exclude septic joint, even though the concern for this was low given the lack of erythema or increased warmth or fever.  There is no evidence of joint effusion on bedside ultrasound.  Rather there is cobblestoning to the olecranon bursa with reproducible pain with palpation.  Again no redness or increased warmth to suggest an infectious bursitis.  Patient is appropriate for discharge and will continue taking her indomethacin, advised her to stop taking the colchicine, sling for comfort, return precautions.    Elmer Sow. Pilar Plate, MD [facility] Emergency Medicine [facility] [email or web address]  Final Clinical Impressions(s) / ED Diagnoses     ICD-10-CM   1. Olecranon bursitis of right elbow  M70.21     ED Discharge Orders    None       Discharge Instructions Discussed with and Provided to Patient:     Discharge Instructions     You were evaluated in the Emergency Department and after careful evaluation, we did not find any emergent condition requiring admission or further testing in the hospital.  Your exam/testing today is overall reassuring.  Please take the indomethacin medication as directed.  Use the sling for comfort.  Please return to the Emergency Department if you experience any worsening of your condition.  We encourage you to follow up with a primary care provider.  Thank you for allowing [place] to be  a part of your care.      Sabas Sous, MD 01/30/19 [phone]

## 2019-04-19 ENCOUNTER — Other Ambulatory Visit: Payer: Self-pay

## 2019-04-19 ENCOUNTER — Encounter (HOSPITAL_BASED_OUTPATIENT_CLINIC_OR_DEPARTMENT_OTHER): Payer: Self-pay | Admitting: Emergency Medicine

## 2019-04-19 ENCOUNTER — Emergency Department (HOSPITAL_BASED_OUTPATIENT_CLINIC_OR_DEPARTMENT_OTHER)
Admission: EM | Admit: 2019-04-19 | Discharge: 2019-04-19 | Disposition: A | Discharge status: Home / Self Care | Payer: BC Managed Care – PPO | Attending: Emergency Medicine | Admitting: Emergency Medicine

## 2019-04-19 DIAGNOSIS — Z720 Tobacco use: Secondary | ICD-10-CM | POA: Diagnosis not present

## 2019-04-19 DIAGNOSIS — I10 Essential (primary) hypertension: Secondary | ICD-10-CM | POA: Diagnosis present

## 2019-04-19 DIAGNOSIS — R42 Dizziness and giddiness: Secondary | ICD-10-CM | POA: Diagnosis not present

## 2019-04-19 DIAGNOSIS — Z79899 Other long term (current) drug therapy: Secondary | ICD-10-CM | POA: Diagnosis not present

## 2019-04-19 HISTORY — DX: Essential (primary) hypertension: I10

## 2019-04-19 LAB — BASIC METABOLIC PANEL
Anion gap: 9 (ref 5–15)
BUN: 10 mg/dL (ref 6–20)
CO2: 24 mmol/L (ref 22–32)
Calcium: 9.1 mg/dL (ref 8.9–10.3)
Chloride: 106 mmol/L (ref 98–111)
Creatinine, Ser: 0.71 mg/dL (ref 0.44–1.00)
GFR calc Af Amer: >60 mL/min (ref >60)
GFR calc non Af Amer: >60 mL/min (ref >60)
Glucose, Bld: 87 mg/dL (ref 70–99)
Potassium: 3.5 mmol/L (ref 3.5–5.1)
Sodium: 139 mmol/L (ref 135–145)

## 2019-04-19 LAB — CBC WITH DIFFERENTIAL/PLATELET
Abs Immature Granulocytes: 0.04 10*3/uL (ref 0.00–0.07)
Basophils Absolute: 0 10*3/uL (ref 0.0–0.1)
Basophils Relative: 0 %
Eosinophils Absolute: 0.3 10*3/uL (ref 0.0–0.5)
Eosinophils Relative: 2 %
HCT: 43.8 % (ref 36.0–46.0)
Hemoglobin: 13.3 g/dL (ref 12.0–15.0)
Immature Granulocytes: 0 %
Lymphocytes Relative: 29 %
Lymphs Abs: 4 10*3/uL (ref 0.7–4.0)
MCH: 28.9 pg (ref 26.0–34.0)
MCHC: 30.4 g/dL (ref 30.0–36.0)
MCV: 95.2 fL (ref 80.0–100.0)
Monocytes Absolute: 1.3 10*3/uL — ABNORMAL HIGH (ref 0.1–1.0)
Monocytes Relative: 9 %
Neutro Abs: 8.2 10*3/uL — ABNORMAL HIGH (ref 1.7–7.7)
Neutrophils Relative %: 60 %
Platelets: 176 10*3/uL (ref 150–400)
RBC: 4.6 MIL/uL (ref 3.87–5.11)
RDW: 14.2 % (ref 11.5–15.5)
WBC: 13.8 10*3/uL — ABNORMAL HIGH (ref 4.0–10.5)
nRBC: 0 % (ref 0.0–0.2)

## 2019-04-19 LAB — CBG MONITORING, ED: Glucose-Capillary: 83 mg/dL (ref 70–99)

## 2019-04-19 NOTE — ED Provider Notes (Signed)
[facility] EMERGENCY DEPARTMENT Provider Note   CSN: [id number] Arrival date & time: 04/19/19  0202     History Chief Complaint  Patient presents with  . Hypertension    Dorothy Jimenez is a 39 y.o. female.  HPI     Is a 39 year old female with a history of hypertension who presents with dizziness and blurred vision.  Patient reports that she generally did not feel well at work.  She thought it was because she was hungry.  She ate something and continue to feel dizzy, lightheaded, and blurred vision.  She went to the nurses office and was noted to have blood pressure of 190 systolic.  She takes amlodipine.  She states that she took her medication today but went home and took an additional 5 mg dose of amlodipine.  She states she feels somewhat better but has some mild persistent dizziness and blurry vision.  She describes the dizziness as lightheadedness.  Denies room spinning dizziness.  She denies any visual field cuts.  She does not wear corrective lenses.  She denies any headache, chest pain, shortness of breath.  She states she has had similar episodes in the past when her blood pressure has gotten high.  Past Medical History:  Diagnosis Date  . Hypertension     There are no problems to display for this patient.   Past Surgical History:  Procedure Laterality Date  . uterine ablation       OB History   No obstetric history on file.     No family history on file.  Social History   Tobacco Use  . Smoking status: Current Some Day Smoker  . Smokeless tobacco: Never Used  Substance Use Topics  . Alcohol use: Yes  . Drug use: Not on file    Home Medications Prior to Admission medications   Medication Sig Start Date End Date Taking? Authorizing Provider  Cholecalciferol (VITAMIN D3) 1.25 MG (50000 UT) CAPS Take 50,000 Units by mouth once a week. 03/17/17   [provider]  ibuprofen (ADVIL,MOTRIN) 200 MG tablet Take 400 mg by mouth every 6 (six)  hours as needed for moderate pain.    [provider]  ibuprofen (ADVIL,MOTRIN) 800 MG tablet Take 1 tablet (800 mg total) by mouth 3 (three) times daily. 02/14/18   Arby Barrette, MD  orphenadrine (NORFLEX) 100 MG tablet Take 1 tablet (100 mg total) by mouth 2 (two) times daily. 02/14/18   Arby Barrette, MD    Allergies    Doxycycline  Review of Systems   Review of Systems  Constitutional: Negative for fever.  Eyes: Positive for visual disturbance. Negative for photophobia, pain and redness.  Respiratory: Negative for shortness of breath.   Cardiovascular: Negative for chest pain.  Gastrointestinal: Negative for abdominal pain, nausea and vomiting.  Genitourinary: Negative for dysuria.  Neurological: Positive for dizziness and light-headedness. Negative for facial asymmetry, speech difficulty, weakness and numbness.  All other systems reviewed and are negative.   Physical Exam Updated Vital Signs BP 140/85   Pulse (!) 55   Temp 99 F (37.2 C) (Oral)   Resp (!) 21   Ht 1.448 m (4\' 9" )   Wt 79.8 kg   LMP 04/18/2019   SpO2 100%   BMI 38.09 kg/m   Physical Exam Vitals and nursing note reviewed.  Constitutional:      Appearance: She is well-developed.     Comments: Overweight, non-ill-appearing  HENT:     Head: Normocephalic and  atraumatic.     Nose: Nose normal.     Mouth/Throat:     Mouth: Mucous membranes are moist.  Eyes:     Extraocular Movements: Extraocular movements intact.     Pupils: Pupils are equal, round, and reactive to light.  Cardiovascular:     Rate and Rhythm: Normal rate and regular rhythm.     Heart sounds: Normal heart sounds.  Pulmonary:     Effort: Pulmonary effort is normal. No respiratory distress.     Breath sounds: No wheezing.  Abdominal:     General: Bowel sounds are normal.     Palpations: Abdomen is soft.     Tenderness: There is no abdominal tenderness.  Musculoskeletal:     Cervical back: Neck supple.     Right lower  leg: No edema.     Left lower leg: No edema.  Skin:    General: Skin is warm and dry.  Neurological:     Mental Status: She is alert and oriented to person, place, and time.     Comments: Cranial nerves II through XII intact, 5 out of 5 strength in all 4 extremities, no dysmetria finger-nose-finger  Psychiatric:        Mood and Affect: Mood normal.     ED Results / Procedures / Treatments   Labs (all labs ordered are listed, but only abnormal results are displayed) Labs Reviewed  CBC WITH DIFFERENTIAL/PLATELET - Abnormal; Notable for the following components:      Result Value   WBC 13.8 (*)    Neutro Abs 8.2 (*)    Monocytes Absolute 1.3 (*)    All other components within normal limits  BASIC METABOLIC PANEL  CBG MONITORING, ED    EKG EKG Interpretation  Date/Time:  Monday April 19 2019 03:39:44 EDT Ventricular Rate:  49 PR Interval:    QRS Duration: 94 QT Interval:  455 QTC Calculation: 411 R Axis:   76 Text Interpretation: Sinus bradycardia Low voltage, precordial leads Baseline wander in lead(s) V4 Confirmed by Thayer Jew [phone]) on 04/19/2019 4:11:45 AM   Radiology No results found.  Procedures Procedures (including critical care time)  Medications Ordered in ED Medications - No data to display  ED Course  I have reviewed the triage vital signs and the nursing notes.  Pertinent labs & imaging results that were available during my care of the patient were reviewed by me and considered in my medical decision making (see chart for details).    MDM Rules/Calculators/A&P                       Patient presents with dizziness and blurry vision.  She reports onset of symptoms at work.  Noted to be hypertensive at work.  She has taken occasional dose of her blood pressure medication.  She is overall nontoxic appearing.  Initial blood pressure is 160s over 90s.  She is neurologically intact.  No signs or symptoms on exam of hypertensive urgency or emergency.   However, given her symptoms, will obtain screening EKG and lab work to assess for endorgan damage.  EKG without ischemic or arrhythmic changes.  Basic lab work is largely reassuring.  CBG is negative.  Patient was observed in the emergency department.  Blood pressure improved to 140/85 and her symptoms completely resolved.  Recommend close follow-up with her primary physician for evaluation for additional blood pressure medications.  Patient was given strict return precautions.  After history, exam, and medical workup  I feel the patient has been appropriately medically screened and is safe for discharge home. Pertinent diagnoses were discussed with the patient. Patient was given return precautions.   Final Clinical Impression(s) / ED Diagnoses Final diagnoses:  Essential hypertension  Dizziness    Rx / DC Orders ED Discharge Orders    None       Yakub Lodes, Mayer Masker, MD 04/19/19 0451

## 2019-04-19 NOTE — ED Triage Notes (Signed)
Pt states she was working [employer] and began feeling dizzy, blurred vision and nauseated. She states she ate something but still did not feel right. She was seen by the nurse at her job and her BP was 191/113. Pt reports hx of HTN and feeling similarly when her BP was elevated. She went home and took Amlodipine. States her nausea and dizziness have resolved, but vision is still slightly blurred. Denies CP or SHOB.

## 2019-04-19 NOTE — Discharge Instructions (Addendum)
You were seen today for high blood pressure.  Your work-up is reassuring.  Follow-up closely with your primary physician for recommendations regarding additional blood pressure medications.  If you develop worsening symptoms, headache, chest pain, any new or worsening symptoms you should be reevaluated.

## 2020-07-20 ENCOUNTER — Other Ambulatory Visit: Payer: Self-pay | Admitting: Obstetrics and Gynecology

## 2020-07-20 DIAGNOSIS — R928 Other abnormal and inconclusive findings on diagnostic imaging of breast: Secondary | ICD-10-CM

## 2020-08-03 ENCOUNTER — Other Ambulatory Visit: Payer: Self-pay

## 2020-08-03 ENCOUNTER — Ambulatory Visit
Admission: RE | Admit: 2020-08-03 | Discharge: 2020-08-03 | Disposition: A | Discharge status: Home / Self Care | Payer: Managed Care, Other (non HMO) | Source: Ambulatory Visit | Attending: Obstetrics and Gynecology | Admitting: Obstetrics and Gynecology

## 2020-08-03 ENCOUNTER — Ambulatory Visit
Admission: RE | Admit: 2020-08-03 | Discharge: 2020-08-03 | Disposition: A | Discharge status: Home / Self Care | Payer: BC Managed Care – PPO | Source: Ambulatory Visit | Attending: Obstetrics and Gynecology | Admitting: Obstetrics and Gynecology

## 2020-08-03 DIAGNOSIS — R928 Other abnormal and inconclusive findings on diagnostic imaging of breast: Secondary | ICD-10-CM

## 2021-02-21 DIAGNOSIS — I10 Essential (primary) hypertension: Secondary | ICD-10-CM | POA: Diagnosis not present

## 2021-02-21 DIAGNOSIS — R7303 Prediabetes: Secondary | ICD-10-CM | POA: Diagnosis not present

## 2021-02-21 DIAGNOSIS — Z0000 Encounter for general adult medical examination without abnormal findings: Secondary | ICD-10-CM | POA: Diagnosis not present

## 2021-02-21 DIAGNOSIS — D508 Other iron deficiency anemias: Secondary | ICD-10-CM | POA: Diagnosis not present

## 2021-02-23 DIAGNOSIS — R7303 Prediabetes: Secondary | ICD-10-CM | POA: Diagnosis not present

## 2021-02-23 DIAGNOSIS — Z13220 Encounter for screening for lipoid disorders: Secondary | ICD-10-CM | POA: Diagnosis not present

## 2021-02-23 DIAGNOSIS — Z0000 Encounter for general adult medical examination without abnormal findings: Secondary | ICD-10-CM | POA: Diagnosis not present

## 2021-02-23 DIAGNOSIS — I10 Essential (primary) hypertension: Secondary | ICD-10-CM | POA: Diagnosis not present

## 2021-09-19 DIAGNOSIS — N760 Acute vaginitis: Secondary | ICD-10-CM | POA: Diagnosis not present

## 2021-09-19 DIAGNOSIS — N915 Oligomenorrhea, unspecified: Secondary | ICD-10-CM | POA: Diagnosis not present

## 2021-10-04 DIAGNOSIS — N898 Other specified noninflammatory disorders of vagina: Secondary | ICD-10-CM | POA: Diagnosis not present

## 2021-10-04 DIAGNOSIS — A5900 Urogenital trichomoniasis, unspecified: Secondary | ICD-10-CM | POA: Diagnosis not present

## 2021-11-15 DIAGNOSIS — G4700 Insomnia, unspecified: Secondary | ICD-10-CM | POA: Diagnosis not present

## 2021-11-15 DIAGNOSIS — F3341 Major depressive disorder, recurrent, in partial remission: Secondary | ICD-10-CM | POA: Diagnosis not present

## 2021-11-15 DIAGNOSIS — F411 Generalized anxiety disorder: Secondary | ICD-10-CM | POA: Diagnosis not present

## 2021-12-03 DIAGNOSIS — F3341 Major depressive disorder, recurrent, in partial remission: Secondary | ICD-10-CM | POA: Diagnosis not present

## 2021-12-03 DIAGNOSIS — F411 Generalized anxiety disorder: Secondary | ICD-10-CM | POA: Diagnosis not present

## 2021-12-03 DIAGNOSIS — G43909 Migraine, unspecified, not intractable, without status migrainosus: Secondary | ICD-10-CM | POA: Diagnosis not present

## 2021-12-03 DIAGNOSIS — I10 Essential (primary) hypertension: Secondary | ICD-10-CM | POA: Diagnosis not present

## 2021-12-07 ENCOUNTER — Emergency Department (HOSPITAL_BASED_OUTPATIENT_CLINIC_OR_DEPARTMENT_OTHER)
Admission: EM | Admit: 2021-12-07 | Discharge: 2021-12-07 | Disposition: A | Discharge status: Home / Self Care | Payer: BC Managed Care – PPO | Attending: Emergency Medicine | Admitting: Emergency Medicine

## 2021-12-07 ENCOUNTER — Emergency Department (HOSPITAL_BASED_OUTPATIENT_CLINIC_OR_DEPARTMENT_OTHER): Payer: BC Managed Care – PPO | Admitting: Radiology

## 2021-12-07 ENCOUNTER — Encounter (HOSPITAL_BASED_OUTPATIENT_CLINIC_OR_DEPARTMENT_OTHER): Payer: Self-pay | Admitting: Emergency Medicine

## 2021-12-07 DIAGNOSIS — Z79899 Other long term (current) drug therapy: Secondary | ICD-10-CM | POA: Insufficient documentation

## 2021-12-07 DIAGNOSIS — R519 Headache, unspecified: Secondary | ICD-10-CM | POA: Diagnosis not present

## 2021-12-07 DIAGNOSIS — I10 Essential (primary) hypertension: Secondary | ICD-10-CM | POA: Diagnosis not present

## 2021-12-07 LAB — CBC
HCT: 49.4 % — ABNORMAL HIGH (ref 36.0–46.0)
Hemoglobin: 16 g/dL — ABNORMAL HIGH (ref 12.0–15.0)
MCH: 28.9 pg (ref 26.0–34.0)
MCHC: 32.4 g/dL (ref 30.0–36.0)
MCV: 89.3 fL (ref 80.0–100.0)
Platelets: 190 10*3/uL (ref 150–400)
RBC: 5.53 MIL/uL — ABNORMAL HIGH (ref 3.87–5.11)
RDW: 14.1 % (ref 11.5–15.5)
WBC: 11.7 10*3/uL — ABNORMAL HIGH (ref 4.0–10.5)
nRBC: 0 % (ref 0.0–0.2)

## 2021-12-07 LAB — BASIC METABOLIC PANEL
Anion gap: 15 (ref 5–15)
BUN: 11 mg/dL (ref 6–20)
CO2: 20 mmol/L — ABNORMAL LOW (ref 22–32)
Calcium: 9.7 mg/dL (ref 8.9–10.3)
Chloride: 106 mmol/L (ref 98–111)
Creatinine, Ser: 0.75 mg/dL (ref 0.44–1.00)
GFR, Estimated: >60 mL/min (ref >60)
Glucose, Bld: 95 mg/dL (ref 70–99)
Potassium: 3.5 mmol/L (ref 3.5–5.1)
Sodium: 141 mmol/L (ref 135–145)

## 2021-12-07 LAB — TROPONIN I (HIGH SENSITIVITY)
Troponin I (High Sensitivity): 3 ng/L (ref <18)
Troponin I (High Sensitivity): 4 ng/L (ref <18)

## 2021-12-07 LAB — PREGNANCY, URINE: Preg Test, Ur: NEGATIVE

## 2021-12-07 NOTE — ED Triage Notes (Signed)
Notice high BP at home 180s/ 110.\ Left arm pain. Headache  Started on BP meds Wednesday.

## 2021-12-07 NOTE — ED Provider Notes (Signed)
[facility] EMERGENCY DEPT  Provider Note  CSN: [id number] Arrival date & time: 12/07/21 0001  History Chief Complaint  Patient presents with   Hypertension    Dorothy Jimenez is a 41 y.o. female with history of HTN had been taken off her meds a few months ago by PCP. She was back at her PCP office earlier this week and noted to be hypertensive again. Amlodipine was restarted on 11/29. Earlier tonight she began to feel strange and had some aching in her L arm as well as a headache. BP was in the 180s at home prompting her to come to the ED. She was not having any CP or SOB. No N/V.    Home Medications Prior to Admission medications   Medication Sig Start Date End Date Taking? Authorizing Provider  Cholecalciferol (VITAMIN D3) 1.25 MG (50000 UT) CAPS Take 50,000 Units by mouth once a week. 03/17/17   [provider]  ibuprofen (ADVIL,MOTRIN) 200 MG tablet Take 400 mg by mouth every 6 (six) hours as needed for moderate pain.    [provider]  ibuprofen (ADVIL,MOTRIN) 800 MG tablet Take 1 tablet (800 mg total) by mouth 3 (three) times daily. 02/14/18   Arby Barrette, MD  orphenadrine (NORFLEX) 100 MG tablet Take 1 tablet (100 mg total) by mouth 2 (two) times daily. 02/14/18   Arby Barrette, MD     Allergies    Doxycycline   Review of Systems   Review of Systems Please see HPI for pertinent positives and negatives  Physical Exam BP (!) 162/98   Pulse 68   Temp 98.2 F (36.8 C) (Oral)   Resp 18   SpO2 100%   Physical Exam Vitals and nursing note reviewed.  Constitutional:      Appearance: Normal appearance.  HENT:     Head: Normocephalic and atraumatic.     Nose: Nose normal.     Mouth/Throat:     Mouth: Mucous membranes are moist.  Eyes:     Extraocular Movements: Extraocular movements intact.     Conjunctiva/sclera: Conjunctivae normal.  Cardiovascular:     Rate and Rhythm: Normal rate.  Pulmonary:     Effort: Pulmonary effort is  normal.     Breath sounds: Normal breath sounds.  Abdominal:     General: Abdomen is flat.     Palpations: Abdomen is soft.     Tenderness: There is no abdominal tenderness.  Musculoskeletal:        General: No swelling. Normal range of motion.     Cervical back: Neck supple.  Skin:    General: Skin is warm and dry.  Neurological:     General: No focal deficit present.     Mental Status: She is alert.  Psychiatric:        Mood and Affect: Mood normal.     ED Results / Procedures / Treatments   EKG None  Procedures Procedures  Medications Ordered in the ED Medications - No data to display  Initial Impression and Plan  Patient with know HTN recently started back on amlodipine but had worsening pressures and L arm aching earlier tonight. 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 Labs done in triage show unremarkable CBC, BMP and trop. Second is pending. I personally viewed the images from radiology studies and agree with radiologist interpretation: CXR is clear. Patient advised that correction of her BP may take several days or  weeks and that as long as her ED workup is reassuring, she should be safe to continue with outpatient management.    ED Course   Clinical Course as of 12/07/21 1478  [facility] Dec 07, 2021  2956 Repeat Trop is still normal. Patient is resting comfortably, no distress. BP has improved without intervention. Recommend she continue her meds, log her BP and follow up with PCP for long term management. [CS]    Clinical Course User Index [CS] Pollyann Savoy, MD     MDM Rules/Calculators/A&P Medical Decision Making Given presenting complaint, I considered that admission might be necessary. After review of results from ED lab and/or imaging studies, admission to the hospital is not indicated at this time.    Problems Addressed: Uncontrolled hypertension: chronic illness or injury with  exacerbation, progression, or side effects of treatment  Amount and/or Complexity of Data Reviewed Labs: ordered. Decision-making details documented in ED Course. Radiology: ordered and independent interpretation performed. Decision-making details documented in ED Course. ECG/medicine tests: ordered and independent interpretation performed. Decision-making details documented in ED Course.  Risk Prescription drug management. Decision regarding hospitalization.    Final Clinical Impression(s) / ED Diagnoses Final diagnoses:  Uncontrolled hypertension    Rx / DC Orders ED Discharge Orders     None        Pollyann Savoy, MD 12/07/21 [phone]

## 2021-12-20 ENCOUNTER — Other Ambulatory Visit: Payer: Self-pay

## 2021-12-20 ENCOUNTER — Emergency Department (HOSPITAL_COMMUNITY)
Admission: EM | Admit: 2021-12-20 | Discharge: 2021-12-20 | Disposition: A | Discharge status: Home / Self Care | Payer: BC Managed Care – PPO | Attending: Emergency Medicine | Admitting: Emergency Medicine

## 2021-12-20 ENCOUNTER — Encounter (HOSPITAL_COMMUNITY): Payer: Self-pay | Admitting: Emergency Medicine

## 2021-12-20 ENCOUNTER — Emergency Department (HOSPITAL_COMMUNITY): Payer: BC Managed Care – PPO

## 2021-12-20 DIAGNOSIS — F439 Reaction to severe stress, unspecified: Secondary | ICD-10-CM | POA: Insufficient documentation

## 2021-12-20 DIAGNOSIS — I10 Essential (primary) hypertension: Secondary | ICD-10-CM | POA: Insufficient documentation

## 2021-12-20 DIAGNOSIS — R0789 Other chest pain: Secondary | ICD-10-CM

## 2021-12-20 DIAGNOSIS — R079 Chest pain, unspecified: Secondary | ICD-10-CM | POA: Diagnosis not present

## 2021-12-20 LAB — CBC WITH DIFFERENTIAL/PLATELET
Abs Immature Granulocytes: 0.02 10*3/uL (ref 0.00–0.07)
Basophils Absolute: 0 10*3/uL (ref 0.0–0.1)
Basophils Relative: 0 %
Eosinophils Absolute: 0.2 10*3/uL (ref 0.0–0.5)
Eosinophils Relative: 1 %
HCT: 49.2 % — ABNORMAL HIGH (ref 36.0–46.0)
Hemoglobin: 15.6 g/dL — ABNORMAL HIGH (ref 12.0–15.0)
Immature Granulocytes: 0 %
Lymphocytes Relative: 28 %
Lymphs Abs: 2.9 10*3/uL (ref 0.7–4.0)
MCH: 28.4 pg (ref 26.0–34.0)
MCHC: 31.7 g/dL (ref 30.0–36.0)
MCV: 89.6 fL (ref 80.0–100.0)
Monocytes Absolute: 1.1 10*3/uL — ABNORMAL HIGH (ref 0.1–1.0)
Monocytes Relative: 11 %
Neutro Abs: 6.2 10*3/uL (ref 1.7–7.7)
Neutrophils Relative %: 60 %
Platelets: 222 10*3/uL (ref 150–400)
RBC: 5.49 MIL/uL — ABNORMAL HIGH (ref 3.87–5.11)
RDW: 13.3 % (ref 11.5–15.5)
WBC: 10.4 10*3/uL (ref 4.0–10.5)
nRBC: 0 % (ref 0.0–0.2)

## 2021-12-20 LAB — BASIC METABOLIC PANEL
Anion gap: 6 (ref 5–15)
BUN: 9 mg/dL (ref 6–20)
CO2: 26 mmol/L (ref 22–32)
Calcium: 9 mg/dL (ref 8.9–10.3)
Chloride: 106 mmol/L (ref 98–111)
Creatinine, Ser: 0.61 mg/dL (ref 0.44–1.00)
GFR, Estimated: >60 mL/min (ref >60)
Glucose, Bld: 80 mg/dL (ref 70–99)
Potassium: 4.1 mmol/L (ref 3.5–5.1)
Sodium: 138 mmol/L (ref 135–145)

## 2021-12-20 LAB — I-STAT BETA HCG BLOOD, ED (MC, WL, AP ONLY): I-stat hCG, quantitative: <5 m[IU]/mL (ref <5)

## 2021-12-20 LAB — TROPONIN I (HIGH SENSITIVITY): Troponin I (High Sensitivity): 3 ng/L (ref <18)

## 2021-12-20 NOTE — ED Triage Notes (Signed)
Pt reports "feeling funny." Stating that her chest feels weird and left arm is hurting. Pt states that her BP was low this morning.

## 2021-12-20 NOTE — ED Provider Notes (Signed)
[facility]-EMERGENCY DEPT Provider Note   CSN: [id number] Arrival date & time: 12/20/21  1302     History  Chief Complaint  Patient presents with   Chest Pain    Dorothy Jimenez is a 41 y.o. female.  The history is provided by the patient and medical records. No language interpreter was used.  Chest Pain    41 year old female significant history of hypertension presenting complaining of chest discomfort.  Patient was last seen 2 weeks ago for elevated blood pressure and was started on amlodipine.  Today she reported having left-sided chest pain with intermittent left arm pain.  She does not endorse any fever chills lightheadedness dizziness shortness of breath nausea or diaphoresis.  She felt her blood pressure has been low while on amlodipine and this morning systolic blood pressure was 109 and at that time she felt dizzy.  She also endorsed increasing life stressors.  She denies any significant family history of premature cardiac death.  No history of diabetes.  Denies having fever productive cough or hemoptysis or shortness of breath.  She described her discomfort as a mild uncomfortable sensation to her left arm and her chest without nausea diaphoresis shortness of breath.  Pain is nonexertional.  Home Medications Prior to Admission medications   Medication Sig Start Date End Date Taking? Authorizing Provider  Cholecalciferol (VITAMIN D3) 1.25 MG (50000 UT) CAPS Take 50,000 Units by mouth once a week. 03/17/17   [provider]  ibuprofen (ADVIL,MOTRIN) 200 MG tablet Take 400 mg by mouth every 6 (six) hours as needed for moderate pain.    [provider]  ibuprofen (ADVIL,MOTRIN) 800 MG tablet Take 1 tablet (800 mg total) by mouth 3 (three) times daily. 02/14/18   Arby Barrette, MD  orphenadrine (NORFLEX) 100 MG tablet Take 1 tablet (100 mg total) by mouth 2 (two) times daily. 02/14/18   Arby Barrette, MD      Allergies    Doxycycline     Review of Systems   Review of Systems  Cardiovascular:  Positive for chest pain.  All other systems reviewed and are negative.   Physical Exam Updated Vital Signs BP (!) 138/94   Pulse 78   Temp 98.6 F (37 C) (Oral)   Resp 16   SpO2 100%  Physical Exam Vitals and nursing note reviewed.  Constitutional:      General: She is not in acute distress.    Appearance: She is well-developed.  HENT:     Head: Atraumatic.  Eyes:     Conjunctiva/sclera: Conjunctivae normal.  Cardiovascular:     Rate and Rhythm: Normal rate and regular rhythm.     Pulses: Normal pulses.     Heart sounds: Normal heart sounds.  Pulmonary:     Effort: Pulmonary effort is normal.  Musculoskeletal:     Cervical back: Neck supple.  Skin:    Findings: No rash.  Neurological:     Mental Status: She is alert.  Psychiatric:        Mood and Affect: Mood normal.     ED Results / Procedures / Treatments   Labs (all labs ordered are listed, but only abnormal results are displayed) Labs Reviewed  CBC WITH DIFFERENTIAL/PLATELET - Abnormal; Notable for the following components:      Result Value   RBC 5.49 (*)    Hemoglobin 15.6 (*)    HCT 49.2 (*)    Monocytes Absolute 1.1 (*)    All other components within  normal limits  BASIC METABOLIC PANEL  I-STAT BETA HCG BLOOD, ED ([facility], [facility], [facility] ONLY)  TROPONIN I (HIGH SENSITIVITY)  TROPONIN I (HIGH SENSITIVITY)    EKG None  Date: 12/20/2021  Rate: 64  Rhythm: normal sinus rhythm  QRS Axis: normal  Intervals: normal  ST/T Wave abnormalities: normal  Conduction Disutrbances: none  Narrative Interpretation:   Old EKG Reviewed: No significant changes noted    Radiology DG Chest 2 View  Result Date: 12/20/2021 CLINICAL DATA:  Left-sided chest pain. EXAM: CHEST - 2 VIEW COMPARISON:  Chest x-ray dated December 07, 2021. FINDINGS: The heart size and mediastinal contours are within normal limits. Both lungs are clear. The visualized skeletal structures  are unremarkable. IMPRESSION: No active cardiopulmonary disease. Electronically Signed   By: Obie Dredge M.D.   On: 12/20/2021 13:41    Procedures Procedures    Medications Ordered in ED Medications - No data to display  ED Course/ Medical Decision Making/ A&P                           Medical Decision Making  BP (!) 138/94   Pulse 78   Temp 98.6 F (37 C) (Oral)   Resp 16   SpO2 100%   35:8 PM  41 year old female significant history of hypertension presenting complaining of chest discomfort.  Patient was last seen 2 weeks ago for elevated blood pressure and was started on amlodipine.  Today she reported having left-sided chest pain with intermittent left arm pain.  She does not endorse any fever chills lightheadedness dizziness shortness of breath nausea or diaphoresis.  She felt her blood pressure has been low while on amlodipine and this morning systolic blood pressure was 109 and at that time she felt dizzy.  She also endorsed increasing life stressors.  She denies any significant family history of premature cardiac death.  No history of diabetes.  Denies having fever productive cough or hemoptysis or shortness of breath.  She described her discomfort as a mild uncomfortable sensation to her left arm and her chest without nausea diaphoresis shortness of breath.  Pain is nonexertional.  On exam this is a well-appearing female sitting in the chair appears to be in no acute discomfort.  Heart with normal rate and rhythm lungs are clear to auscultation no chest wall tenderness, equal strength to bilateral upper extremities with intact radial pulses and normal grip strength.  Abdomen is soft nontender.  Vital signs reviewed and are normal.  No hypotension.  -Labs ordered, independently viewed and interpreted by me.  Labs remarkable for reassuring electrolytes and normal troponin -The patient was maintained on a cardiac monitor.  I personally viewed and interpreted the cardiac monitored  which showed an underlying rhythm of: NSR -Imaging independently viewed and interpreted by me and I agree with radiologist's interpretation.  Result remarkable for CXR without changes -This patient presents to the ED for concern of CP, this involves an extensive number of treatment options, and is a complaint that carries with it a high risk of complications and morbidity.  The differential diagnosis includes acs, costochondritis, anxiety, shingles -Co morbidities that complicate the patient evaluation includes HTN -Treatment includes reassurance -Reevaluation of the patient after these medicines showed that the patient improved -PCP office notes or outside notes reviewed -Escalation to admission/observation considered: patients feels much better, is comfortable with discharge, and will follow up with PCP -Prescription medication considered, patient comfortable with home meds -Social Determinant of  Health considered which includes tobacco use, recommend cessation  6:41 PM Patient complains of left-sided chest pain radiates to her left arm intermittently throughout the day today.  Similar presentation 2 weeks ago when she was noted to have elevated blood pressure and was started on amlodipine.  She was concerned that her blood pressure is low since being on this medication.  However, vital sign currently is within normal limit.  Workup unremarkable.  Her hear score is 1 low risk of Mace.  She is PERC negative, doubt PE.  Encourage patient to follow-up with PCP for further care.  Return precaution given.         Final Clinical Impression(s) / ED Diagnoses Final diagnoses:  Atypical chest pain    Rx / DC Orders ED Discharge Orders     None         Fayrene Helper, PA-C 12/20/21 1843    Sloan Leiter, DO 12/21/21 0005

## 2021-12-20 NOTE — ED Provider Triage Note (Signed)
Emergency Medicine Provider Triage Evaluation Note  DENIAH SAIA , a 41 y.o. female  was evaluated in triage.  Pt complains of left side chest pain and left intermittent arm pain.  She states she was seen for similar symptoms a couple of weeks ago and was restarted on her amlodipine.  She reports her BP was low this morning, lowest SBP was 109 and she felt dizzy and off balance.  Patient states she has increased stress in her life as well.   Review of Systems  Positive: See above Negative: See above  Physical Exam  BP 127/88   Pulse 66   Temp 98.2 F (36.8 C) (Oral)   Resp 18   SpO2 99%  Gen:   Awake, no distress   Resp:  Normal effort, lungs clear to auscultation bilaterally MSK:   Moves extremities without difficulty  Other:  Skin warm, dry, non diaphoretic   Medical Decision Making  Medically screening exam initiated at 1:27 PM.  Appropriate orders placed.  SAKARI RAISANEN was informed that the remainder of the evaluation will be completed by another provider, this initial triage assessment does not replace that evaluation, and the importance of remaining in the ED until their evaluation is complete.     Melton Alar R, [place] 12/20/21 1328

## 2021-12-25 DIAGNOSIS — G4700 Insomnia, unspecified: Secondary | ICD-10-CM | POA: Diagnosis not present

## 2021-12-25 DIAGNOSIS — F3341 Major depressive disorder, recurrent, in partial remission: Secondary | ICD-10-CM | POA: Diagnosis not present

## 2021-12-25 DIAGNOSIS — F411 Generalized anxiety disorder: Secondary | ICD-10-CM | POA: Diagnosis not present

## 2021-12-25 DIAGNOSIS — I10 Essential (primary) hypertension: Secondary | ICD-10-CM | POA: Diagnosis not present

## 2021-12-26 ENCOUNTER — Other Ambulatory Visit (HOSPITAL_COMMUNITY): Payer: Self-pay | Admitting: Family Medicine

## 2021-12-26 DIAGNOSIS — I10 Essential (primary) hypertension: Secondary | ICD-10-CM

## 2021-12-27 DIAGNOSIS — N760 Acute vaginitis: Secondary | ICD-10-CM | POA: Diagnosis not present

## 2021-12-27 DIAGNOSIS — Z1231 Encounter for screening mammogram for malignant neoplasm of breast: Secondary | ICD-10-CM | POA: Diagnosis not present

## 2021-12-27 DIAGNOSIS — Z6835 Body mass index (BMI) 35.0-35.9, adult: Secondary | ICD-10-CM | POA: Diagnosis not present

## 2021-12-27 DIAGNOSIS — Z01419 Encounter for gynecological examination (general) (routine) without abnormal findings: Secondary | ICD-10-CM | POA: Diagnosis not present

## 2021-12-27 DIAGNOSIS — Z124 Encounter for screening for malignant neoplasm of cervix: Secondary | ICD-10-CM | POA: Diagnosis not present

## 2022-01-03 ENCOUNTER — Ambulatory Visit (HOSPITAL_COMMUNITY)
Admission: RE | Admit: 2022-01-03 | Discharge: 2022-01-03 | Disposition: A | Discharge status: Home / Self Care | Payer: BC Managed Care – PPO | Source: Ambulatory Visit | Attending: Family Medicine | Admitting: Family Medicine

## 2022-01-03 DIAGNOSIS — I10 Essential (primary) hypertension: Secondary | ICD-10-CM | POA: Diagnosis not present

## 2022-01-03 DIAGNOSIS — Z8249 Family history of ischemic heart disease and other diseases of the circulatory system: Secondary | ICD-10-CM | POA: Diagnosis not present

## 2022-01-03 DIAGNOSIS — R079 Chest pain, unspecified: Secondary | ICD-10-CM | POA: Diagnosis not present

## 2022-01-03 LAB — ECHOCARDIOGRAM COMPLETE
Area-P 1/2: 2.49 cm2
Calc EF: 57.7 %
MV VTI: 2.13 cm2
S' Lateral: 2.9 cm
Single Plane A2C EF: 55.3 %
Single Plane A4C EF: 61 %

## 2022-01-03 NOTE — Progress Notes (Signed)
  Echocardiogram 2D Echocardiogram has been performed.  Dorothy Jimenez 01/03/2022, 1:55 PM

## 2022-01-04 DIAGNOSIS — Z20822 Contact with and (suspected) exposure to covid-19: Secondary | ICD-10-CM | POA: Diagnosis not present

## 2022-01-04 DIAGNOSIS — F411 Generalized anxiety disorder: Secondary | ICD-10-CM | POA: Diagnosis not present

## 2022-01-04 DIAGNOSIS — R519 Headache, unspecified: Secondary | ICD-10-CM | POA: Diagnosis not present

## 2022-01-04 DIAGNOSIS — F3341 Major depressive disorder, recurrent, in partial remission: Secondary | ICD-10-CM | POA: Diagnosis not present

## 2022-01-04 DIAGNOSIS — I10 Essential (primary) hypertension: Secondary | ICD-10-CM | POA: Diagnosis not present

## 2022-01-04 DIAGNOSIS — J029 Acute pharyngitis, unspecified: Secondary | ICD-10-CM | POA: Diagnosis not present

## 2022-01-22 DIAGNOSIS — G4700 Insomnia, unspecified: Secondary | ICD-10-CM | POA: Diagnosis not present

## 2022-01-22 DIAGNOSIS — F3341 Major depressive disorder, recurrent, in partial remission: Secondary | ICD-10-CM | POA: Diagnosis not present

## 2022-01-22 DIAGNOSIS — F411 Generalized anxiety disorder: Secondary | ICD-10-CM | POA: Diagnosis not present

## 2022-01-22 DIAGNOSIS — G43909 Migraine, unspecified, not intractable, without status migrainosus: Secondary | ICD-10-CM | POA: Diagnosis not present

## 2022-02-28 DIAGNOSIS — I10 Essential (primary) hypertension: Secondary | ICD-10-CM | POA: Diagnosis not present

## 2022-02-28 DIAGNOSIS — E782 Mixed hyperlipidemia: Secondary | ICD-10-CM | POA: Diagnosis not present

## 2022-02-28 DIAGNOSIS — R7303 Prediabetes: Secondary | ICD-10-CM | POA: Diagnosis not present

## 2022-02-28 DIAGNOSIS — F411 Generalized anxiety disorder: Secondary | ICD-10-CM | POA: Diagnosis not present

## 2022-02-28 DIAGNOSIS — Z0000 Encounter for general adult medical examination without abnormal findings: Secondary | ICD-10-CM | POA: Diagnosis not present

## 2022-02-28 DIAGNOSIS — E559 Vitamin D deficiency, unspecified: Secondary | ICD-10-CM | POA: Diagnosis not present

## 2022-02-28 DIAGNOSIS — F3341 Major depressive disorder, recurrent, in partial remission: Secondary | ICD-10-CM | POA: Diagnosis not present

## 2022-03-21 DIAGNOSIS — Z113 Encounter for screening for infections with a predominantly sexual mode of transmission: Secondary | ICD-10-CM | POA: Diagnosis not present

## 2022-03-21 DIAGNOSIS — N760 Acute vaginitis: Secondary | ICD-10-CM | POA: Diagnosis not present

## 2022-03-21 DIAGNOSIS — N941 Unspecified dyspareunia: Secondary | ICD-10-CM | POA: Diagnosis not present

## 2022-03-21 DIAGNOSIS — N390 Urinary tract infection, site not specified: Secondary | ICD-10-CM | POA: Diagnosis not present

## 2022-05-31 IMAGING — MG MM DIGITAL DIAGNOSTIC UNILAT*R* W/ TOMO W/ CAD
8 series · 8 of 24 positions shown · non-contrast
Comparison: Previous exam(s).

CLINICAL DATA: 40-year-old female presenting as a recall from
baseline screening for possible right breast asymmetry.

EXAM:
DIGITAL DIAGNOSTIC UNILATERAL RIGHT MAMMOGRAM WITH TOMOSYNTHESIS AND
CAD; ULTRASOUND RIGHT BREAST LIMITED
TECHNIQUE: Right digital diagnostic mammography and breast tomosynthesis was
performed. The images were evaluated with computer-aided detection.;
Targeted ultrasound examination of the right breast was performed

[R CC synth-2D (1 of 2)]
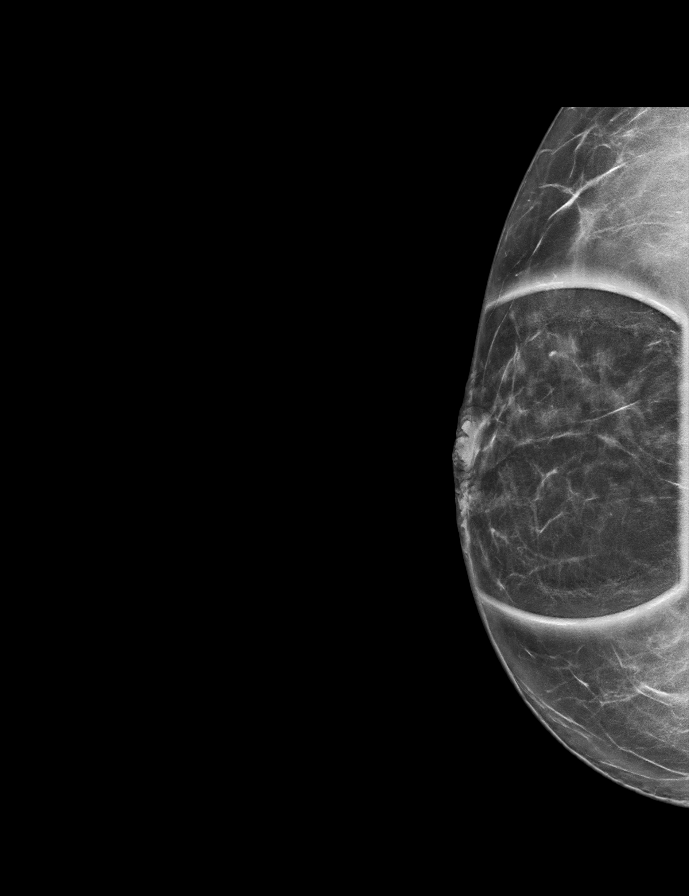

[R MLO synth-2D]
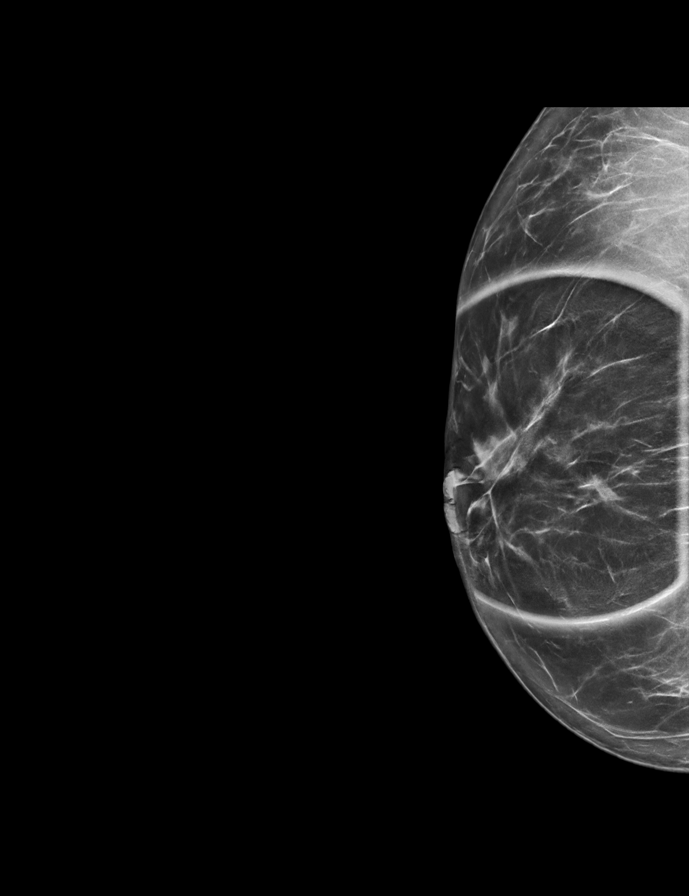

[R CC synth-2D (2 of 2)]
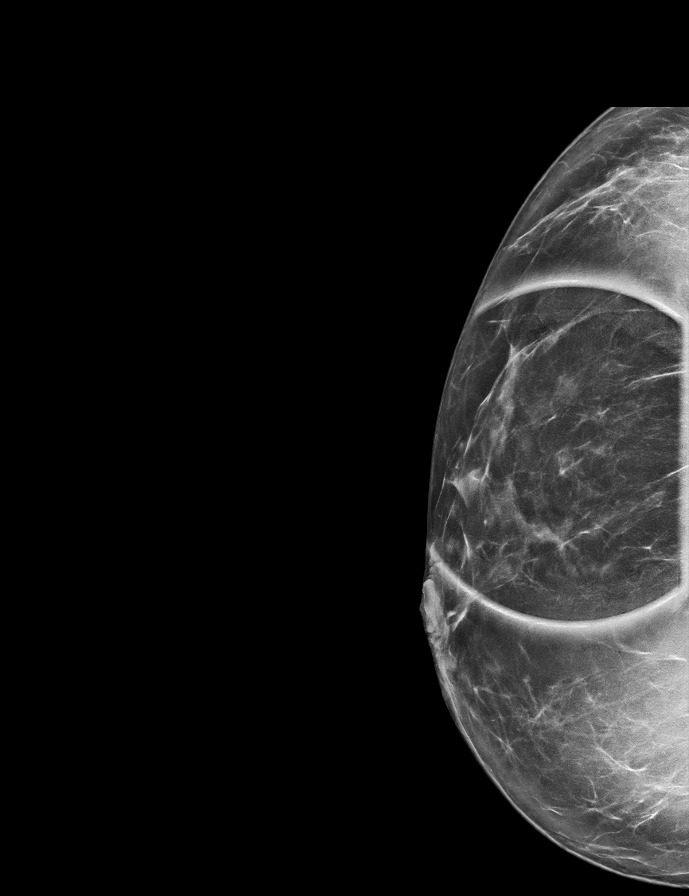

[R ML synth-2D]
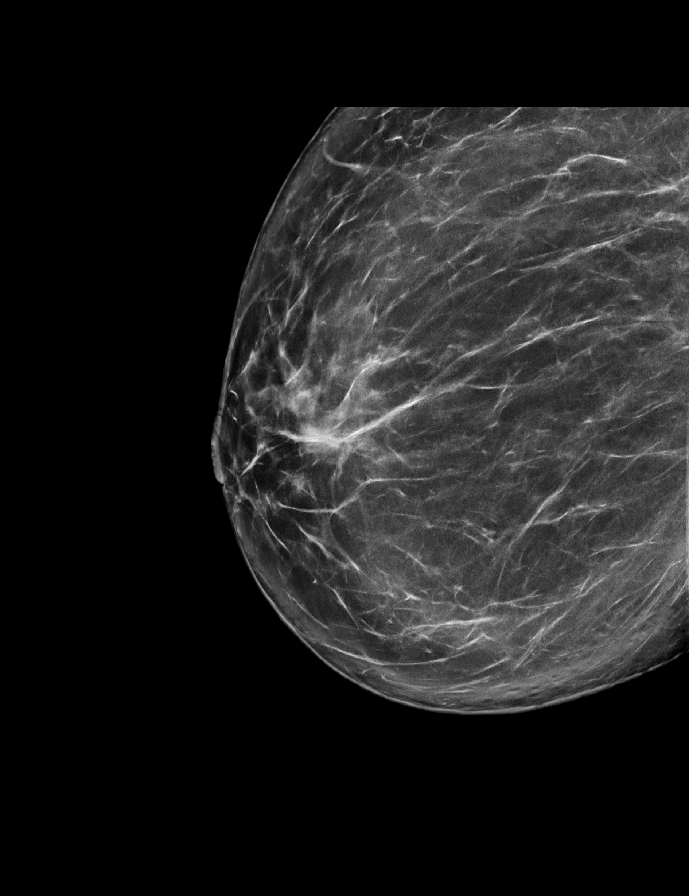

[R CC tomo (1 of 2) · tomo slice 35/68.0]
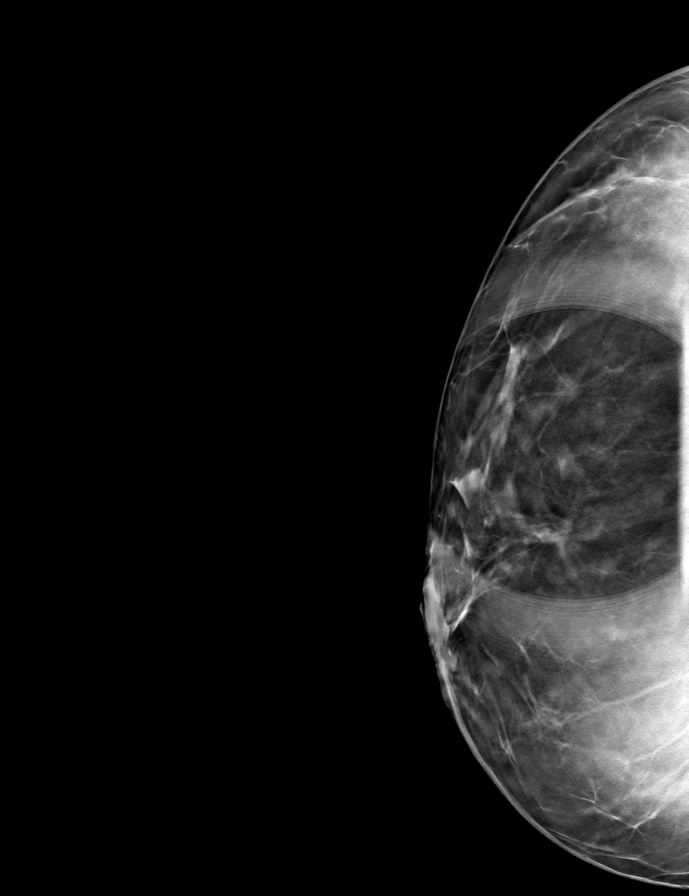

[R CC tomo (2 of 2) · tomo slice 31/61.0]
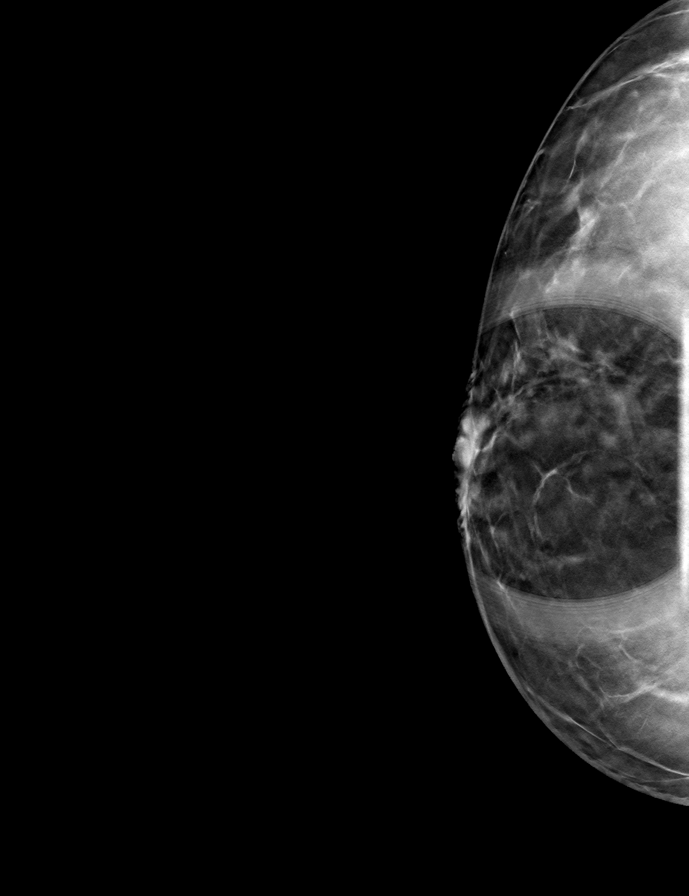

[R MLO tomo · tomo slice 40/79.0]
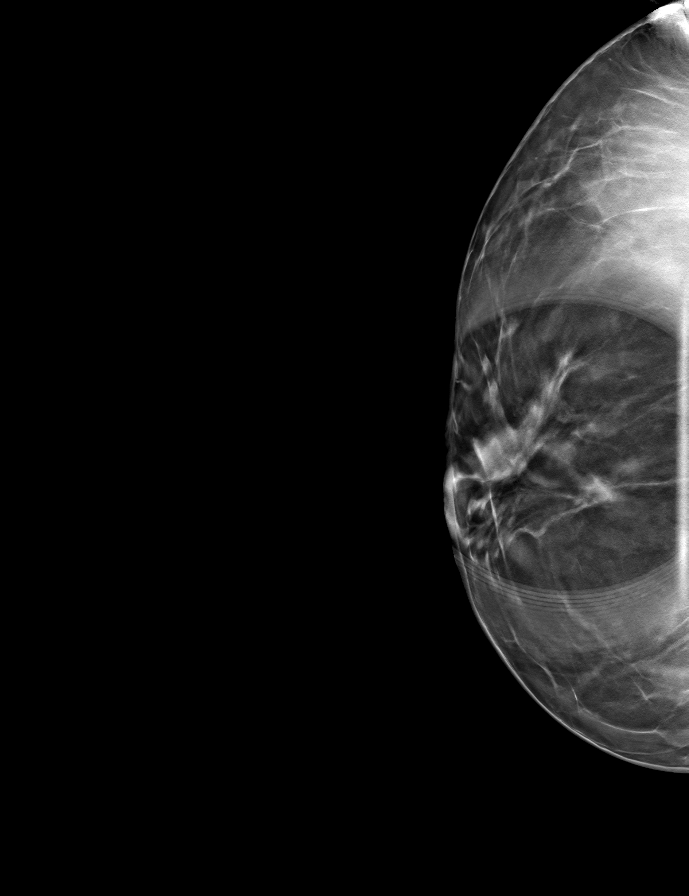

[R ML tomo · tomo slice 44/87.0]
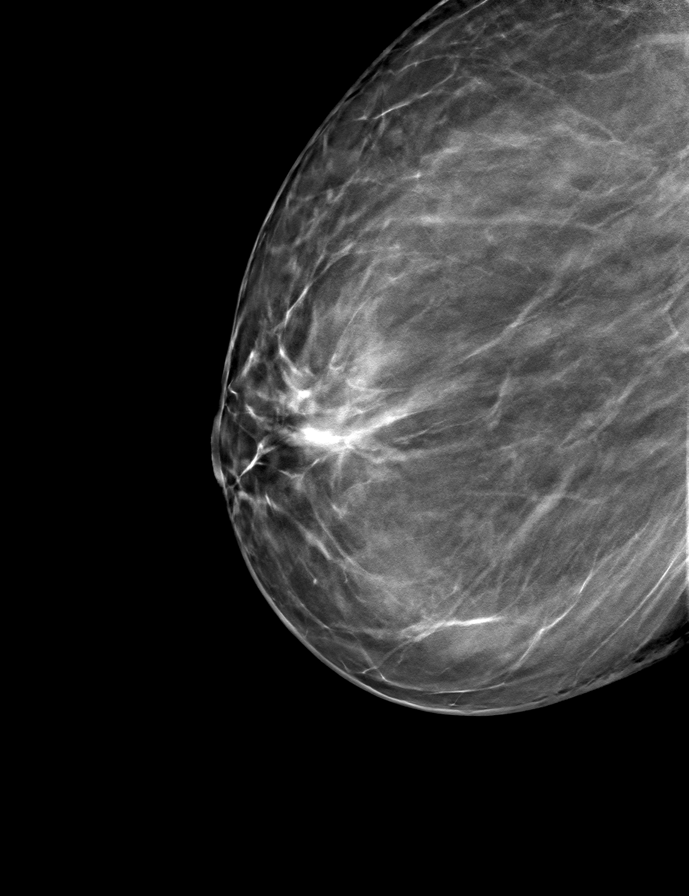

[8 of 24 positions shown; findings below may reference images not displayed]

ACR Breast Density Category b: There are scattered areas of
fibroglandular density.
FINDINGS: Mammogram:

Spot compression tomosynthesis and full field mL tomosynthesis views
of the right breast were performed for a questioned asymmetry in the
outer right breast anterior depth. On the additional imaging there
is no definite persistent asymmetry, mass, or distortion.

On physical exam of the right breast there is flattening of the
nipple which patient states is chronic. I do not feel a discrete
mass or focal area of thickening in the slightly outer right breast.

Ultrasound:

Targeted ultrasound performed throughout the outer aspect of the
right breast demonstrating no cystic or solid mass.
IMPRESSION: No mammographic or sonographic evidence of malignancy in the right
breast.

RECOMMENDATION:
Screening mammogram in one year.(Code:GE-P-UIE)

I have discussed the findings and recommendations with the patient.
If applicable, a reminder letter will be sent to the patient
regarding the next appointment.

BI-RADS CATEGORY  1: Negative.

## 2022-09-24 DIAGNOSIS — R7303 Prediabetes: Secondary | ICD-10-CM | POA: Diagnosis not present

## 2022-09-24 DIAGNOSIS — G43909 Migraine, unspecified, not intractable, without status migrainosus: Secondary | ICD-10-CM | POA: Diagnosis not present

## 2022-09-24 DIAGNOSIS — E782 Mixed hyperlipidemia: Secondary | ICD-10-CM | POA: Diagnosis not present

## 2022-09-24 DIAGNOSIS — I10 Essential (primary) hypertension: Secondary | ICD-10-CM | POA: Diagnosis not present

## 2023-04-11 DIAGNOSIS — E782 Mixed hyperlipidemia: Secondary | ICD-10-CM | POA: Diagnosis not present

## 2023-04-11 DIAGNOSIS — G43909 Migraine, unspecified, not intractable, without status migrainosus: Secondary | ICD-10-CM | POA: Diagnosis not present

## 2023-04-11 DIAGNOSIS — E559 Vitamin D deficiency, unspecified: Secondary | ICD-10-CM | POA: Diagnosis not present

## 2023-04-11 DIAGNOSIS — M79673 Pain in unspecified foot: Secondary | ICD-10-CM | POA: Diagnosis not present

## 2023-04-11 DIAGNOSIS — Z0000 Encounter for general adult medical examination without abnormal findings: Secondary | ICD-10-CM | POA: Diagnosis not present

## 2023-04-11 DIAGNOSIS — K219 Gastro-esophageal reflux disease without esophagitis: Secondary | ICD-10-CM | POA: Diagnosis not present

## 2023-04-11 DIAGNOSIS — I10 Essential (primary) hypertension: Secondary | ICD-10-CM | POA: Diagnosis not present

## 2023-04-11 DIAGNOSIS — R7303 Prediabetes: Secondary | ICD-10-CM | POA: Diagnosis not present

## 2023-05-02 ENCOUNTER — Ambulatory Visit: Admitting: Podiatry

## 2023-05-02 DIAGNOSIS — B351 Tinea unguium: Secondary | ICD-10-CM | POA: Diagnosis not present

## 2023-05-02 DIAGNOSIS — Q666 Other congenital valgus deformities of feet: Secondary | ICD-10-CM

## 2023-05-02 MED ORDER — TERBINAFINE HCL 250 MG PO TABS: 250.0000 mg | ORAL_TABLET | Freq: Every day | ORAL | 0 refills | Status: AC

## 2023-05-02 NOTE — Progress Notes (Signed)
 Orthotics   Patient was present and evaluated for Custom molded foot orthotics. Patient will benefit from CFO's to provide total contact to BIL MLA's helping to balance and distribute body weight more evenly across BIL feet helping to reduce plantar pressure and pain. Orthotic will also encourage FF / RF alignment  Patient was scanned today and will return for fitting upon receipt

## 2023-05-02 NOTE — Progress Notes (Signed)
 Subjective:  Patient ID: Dorothy Jimenez, female    DOB: [birth year],  MRN: [id number]  Chief Complaint  Patient presents with   Foot Pain    43 y.o. female presents with the above complaint.  Patient presents with thickened dystrophic mycotic toenails x 10 mild pain on palpation.  Patient would like to discuss treatment options for nail fungus she would like to discuss oral medication.  She has tried over-the-counter option which has not helped.  She also presents with flatfoot deformity.  She wanted to discuss orthotics option she has not worn orthotics in the past denies any other acute complaints   Review of Systems: Negative except as noted in the HPI. Denies N/V/F/Ch.  Past Medical History:  Diagnosis Date   Hypertension     Current Outpatient Medications:    terbinafine  (LAMISIL ) 250 MG tablet, Take 1 tablet (250 mg total) by mouth daily., Disp: 90 tablet, Rfl: 0   Cholecalciferol (VITAMIN D3) 1.25 MG (50000 UT) CAPS, Take 50,000 Units by mouth once a week., Disp: , Rfl:    ibuprofen  (ADVIL ,MOTRIN ) 200 MG tablet, Take 400 mg by mouth every 6 (six) hours as needed for moderate pain., Disp: , Rfl:    ibuprofen  (ADVIL ,MOTRIN ) 800 MG tablet, Take 1 tablet (800 mg total) by mouth 3 (three) times daily., Disp: 21 tablet, Rfl: 0   orphenadrine  (NORFLEX ) 100 MG tablet, Take 1 tablet (100 mg total) by mouth 2 (two) times daily., Disp: 30 tablet, Rfl: 0  Social History   Tobacco Use  Smoking Status Every Day   Types: Cigarettes  Smokeless Tobacco Never    Allergies  Allergen Reactions   Doxycycline Nausea And Vomiting   Objective:  There were no vitals filed for this visit. There is no height or weight on file to calculate BMI. Constitutional Well developed. Well nourished.  Vascular Dorsalis pedis pulses palpable bilaterally. Posterior tibial pulses palpable bilaterally. Capillary refill normal to all digits.  No cyanosis or clubbing noted. Pedal hair growth normal.   Neurologic Normal speech. Oriented to person, place, and time. Epicritic sensation to light touch grossly present bilaterally.  Dermatologic Nails thickened onychodystrophy mycotic toenails x 10 Skin within normal limits  Orthopedic: Pes planovalgus foot structure noted with calcaneovalgus to many toe signs partially but recurred the arch with dorsiflexion of the hallux unable to perform single and double heel raise.   Radiographs: None Assessment:   1. Nail fungus   2. Onychomycosis due to dermatophyte   3. Pes planovalgus    Plan:  Patient was evaluated and treated and all questions answered.  Toenails x 10 onychomycosis -Educated the patient on the etiology of onychomycosis and various treatment options associated with improving the fungal load.  I explained to the patient that there is 3 treatment options available to treat the onychomycosis including topical, p.o., laser treatment.  Patient elected to undergo p.o. options with Lamisil /terbinafine  therapy.  In order for me to start the medication therapy, I explained to the patient the importance of evaluating the liver and obtaining the liver function test.  Once the liver function test comes back normal I will start him on 13-month course of Lamisil  therapy.  Patient understood all risk and would like to proceed with Lamisil  therapy.  I have asked the patient to immediately stop the Lamisil  therapy if she has any reactions to it and call the office or go to the emergency room right away.  Patient states understanding   Pes planovalgus -I explained to patient  the etiology of pes planovalgus and relationship with Planter fasciitis and various treatment options were discussed.  Given patient foot structure in the setting of Planter fasciitis I believe patient will benefit from custom-made orthotics to help control the hindfoot motion support the arch of the foot and take the stress away from plantar fascial.  Patient agrees with the plan  like to proceed with orthotics -Patient was casted for orthotics

## 2023-08-08 ENCOUNTER — Ambulatory Visit: Admitting: Podiatry

## 2023-08-08 ENCOUNTER — Ambulatory Visit

## 2023-08-08 DIAGNOSIS — Q666 Other congenital valgus deformities of feet: Secondary | ICD-10-CM | POA: Diagnosis not present

## 2023-08-08 DIAGNOSIS — B351 Tinea unguium: Secondary | ICD-10-CM

## 2023-08-08 NOTE — Progress Notes (Signed)
 Dr Tobie fit orthotics to patient today  Dorothy Jimenez

## 2023-08-08 NOTE — Progress Notes (Signed)
 Subjective:  Patient ID: Dorothy Jimenez, female    DOB: Mar 24, 1980,  MRN: [id number]  Chief Complaint  Patient presents with   Nail Problem    Nail fungus follow up     43 y.o. female presents with the above complaint.  Patient presents for follow-up of nail fungus.  She states Lamisil  has helped however she has not been compliant with the medication she still has 1 month left.  Denies any other acute issues.  She is here to pick up her orthotics as well   Review of Systems: Negative except as noted in the HPI. Denies N/V/F/Ch.  Past Medical History:  Diagnosis Date   Hypertension     Current Outpatient Medications:    Cholecalciferol (VITAMIN D3) 1.25 MG (50000 UT) CAPS, Take 50,000 Units by mouth once a week., Disp: , Rfl:    ibuprofen  (ADVIL ,MOTRIN ) 200 MG tablet, Take 400 mg by mouth every 6 (six) hours as needed for moderate pain., Disp: , Rfl:    ibuprofen  (ADVIL ,MOTRIN ) 800 MG tablet, Take 1 tablet (800 mg total) by mouth 3 (three) times daily., Disp: 21 tablet, Rfl: 0   orphenadrine  (NORFLEX ) 100 MG tablet, Take 1 tablet (100 mg total) by mouth 2 (two) times daily., Disp: 30 tablet, Rfl: 0   terbinafine  (LAMISIL ) 250 MG tablet, Take 1 tablet (250 mg total) by mouth daily., Disp: 90 tablet, Rfl: 0  Social History   Tobacco Use  Smoking Status Every Day   Types: Cigarettes  Smokeless Tobacco Never    Allergies  Allergen Reactions   Doxycycline Nausea And Vomiting   Objective:  There were no vitals filed for this visit. There is no height or weight on file to calculate BMI. Constitutional Well developed. Well nourished.  Vascular Dorsalis pedis pulses palpable bilaterally. Posterior tibial pulses palpable bilaterally. Capillary refill normal to all digits.  No cyanosis or clubbing noted. Pedal hair growth normal.  Neurologic Normal speech. Oriented to person, place, and time. Epicritic sensation to light touch grossly present bilaterally.  Dermatologic Nails  thickened onychodystrophy mycotic toenails x 10 Skin within normal limits  Orthopedic: Pes planovalgus foot structure noted with calcaneovalgus to many toe signs partially but recurred the arch with dorsiflexion of the hallux unable to perform single and double heel raise.   Radiographs: None Assessment:   No diagnosis found.  Plan:  Patient was evaluated and treated and all questions answered.  Toenails x 10 onychomycosis -Educated the patient on the etiology of onychomycosis and various treatment options associated with improving the fungal load.  I explained to the patient that there is 3 treatment options available to treat the onychomycosis including topical, p.o., laser treatment.  Patient elected to undergo p.o. options with Lamisil /terbinafine  therapy.  In order for me to start the medication therapy, I explained to the patient the importance of evaluating the liver and obtaining the liver function test.  Once the liver function test comes back normal I will start him on 36-month course of Lamisil  therapy.  Patient understood all risk and would like to proceed with Lamisil  therapy.  I have asked the patient to immediately stop the Lamisil  therapy if she has any reactions to it and call the office or go to the emergency room right away.  Patient states understanding - Patient still has 1 more month of Lamisil  left.  Encouraged her to finish her course and will come back and see me when she has completed the course to do another round.   Pes  planovalgus -I explained to patient the etiology of pes planovalgus and relationship with Planter fasciitis and various treatment options were discussed.  Given patient foot structure in the setting of Planter fasciitis I believe patient will benefit from custom-made orthotics to help control the hindfoot motion support the arch of the foot and take the stress away from plantar fascial.  Patient agrees with the plan like to proceed with orthotics -  Orthotics were dispensed they are functioning well.  No complaints noted.  Break-in period discussed

## 2023-09-24 DIAGNOSIS — I10 Essential (primary) hypertension: Secondary | ICD-10-CM | POA: Diagnosis not present

## 2023-09-24 DIAGNOSIS — Z6838 Body mass index (BMI) 38.0-38.9, adult: Secondary | ICD-10-CM | POA: Diagnosis not present

## 2023-09-24 DIAGNOSIS — E559 Vitamin D deficiency, unspecified: Secondary | ICD-10-CM | POA: Diagnosis not present

## 2023-09-24 DIAGNOSIS — G43909 Migraine, unspecified, not intractable, without status migrainosus: Secondary | ICD-10-CM | POA: Diagnosis not present

## 2023-09-24 DIAGNOSIS — R7303 Prediabetes: Secondary | ICD-10-CM | POA: Diagnosis not present

## 2023-09-24 DIAGNOSIS — E782 Mixed hyperlipidemia: Secondary | ICD-10-CM | POA: Diagnosis not present
